# Patient Record
Sex: Male | Born: 1955 | Race: White | Hispanic: No | Marital: Married | Smoking: Never smoker
Health system: Southern US, Community
[De-identification: ages and names within clinical notes are randomized; demographics above are authoritative.]

## PROBLEM LIST (undated history)

## (undated) DIAGNOSIS — E785 Hyperlipidemia, unspecified: Secondary | ICD-10-CM

## (undated) DIAGNOSIS — K219 Gastro-esophageal reflux disease without esophagitis: Secondary | ICD-10-CM

## (undated) HISTORY — DX: Hyperlipidemia, unspecified: E78.5

## (undated) HISTORY — PX: BACK SURGERY: SHX140

---

## 2011-01-21 ENCOUNTER — Ambulatory Visit: Payer: Self-pay | Admitting: Family Medicine

## 2014-07-26 ENCOUNTER — Emergency Department (HOSPITAL_COMMUNITY): Payer: Managed Care, Other (non HMO)

## 2014-07-26 ENCOUNTER — Encounter (HOSPITAL_COMMUNITY): Payer: Self-pay | Admitting: *Deleted

## 2014-07-26 ENCOUNTER — Observation Stay (HOSPITAL_COMMUNITY)
Admission: EM | Admit: 2014-07-26 | Discharge: 2014-07-27 | Disposition: A | Discharge status: Home / Self Care | Payer: Managed Care, Other (non HMO) | Attending: Internal Medicine | Admitting: Internal Medicine

## 2014-07-26 DIAGNOSIS — R079 Chest pain, unspecified: Secondary | ICD-10-CM | POA: Diagnosis not present

## 2014-07-26 DIAGNOSIS — Z79899 Other long term (current) drug therapy: Secondary | ICD-10-CM | POA: Insufficient documentation

## 2014-07-26 DIAGNOSIS — R030 Elevated blood-pressure reading, without diagnosis of hypertension: Secondary | ICD-10-CM

## 2014-07-26 DIAGNOSIS — E66811 Obesity, class 1: Secondary | ICD-10-CM

## 2014-07-26 DIAGNOSIS — Z7982 Long term (current) use of aspirin: Secondary | ICD-10-CM | POA: Diagnosis not present

## 2014-07-26 DIAGNOSIS — E669 Obesity, unspecified: Secondary | ICD-10-CM

## 2014-07-26 DIAGNOSIS — IMO0001 Reserved for inherently not codable concepts without codable children: Secondary | ICD-10-CM

## 2014-07-26 DIAGNOSIS — M5412 Radiculopathy, cervical region: Secondary | ICD-10-CM

## 2014-07-26 DIAGNOSIS — E785 Hyperlipidemia, unspecified: Secondary | ICD-10-CM

## 2014-07-26 HISTORY — DX: Gastro-esophageal reflux disease without esophagitis: K21.9

## 2014-07-26 LAB — URINALYSIS, ROUTINE W REFLEX MICROSCOPIC
Bilirubin Urine: NEGATIVE
Glucose, UA: NEGATIVE mg/dL
Hgb urine dipstick: NEGATIVE
Ketones, ur: NEGATIVE mg/dL
Leukocytes, UA: NEGATIVE
Nitrite: NEGATIVE
Protein, ur: NEGATIVE mg/dL
Specific Gravity, Urine: 1.018 (ref 1.005–1.030)
Urobilinogen, UA: 0.2 mg/dL (ref 0.0–1.0)
pH: 7.5 (ref 5.0–8.0)

## 2014-07-26 LAB — CBC
HCT: 40.6 % (ref 39.0–52.0)
HCT: 39.4 % (ref 39.0–52.0)
HEMOGLOBIN: 13.8 g/dL (ref 13.0–17.0)
Hemoglobin: 14.3 g/dL (ref 13.0–17.0)
MCH: 29.3 pg (ref 26.0–34.0)
MCH: 29.1 pg (ref 26.0–34.0)
MCHC: 35.2 g/dL (ref 30.0–36.0)
MCHC: 35 g/dL (ref 30.0–36.0)
MCV: 83.2 fL (ref 78.0–100.0)
MCV: 82.9 fL (ref 78.0–100.0)
Platelets: 243 10*3/uL (ref 150–400)
Platelets: 225 10*3/uL (ref 150–400)
RBC: 4.88 MIL/uL (ref 4.22–5.81)
RBC: 4.75 MIL/uL (ref 4.22–5.81)
RDW: 13.4 % (ref 11.5–15.5)
RDW: 13.2 % (ref 11.5–15.5)
WBC: 7.2 10*3/uL (ref 4.0–10.5)
WBC: 6.2 10*3/uL (ref 4.0–10.5)

## 2014-07-26 LAB — BASIC METABOLIC PANEL
Anion gap: 8 (ref 5–15)
BUN: 12 mg/dL (ref 6–23)
CALCIUM: 9.4 mg/dL (ref 8.4–10.5)
CO2: 25 mmol/L (ref 19–32)
Chloride: 107 mmol/L (ref 96–112)
Creatinine, Ser: 1 mg/dL (ref 0.50–1.35)
GFR calc Af Amer: >90 mL/min (ref >90)
GFR calc non Af Amer: 81 mL/min — ABNORMAL LOW (ref >90)
Glucose, Bld: 95 mg/dL (ref 70–99)
Potassium: 4.6 mmol/L (ref 3.5–5.1)
Sodium: 140 mmol/L (ref 135–145)

## 2014-07-26 LAB — TROPONIN I
Troponin I: <0.03 ng/mL (ref <0.031)
Troponin I: <0.03 ng/mL (ref <0.031)
Troponin I: <0.03 ng/mL (ref <0.031)

## 2014-07-26 LAB — CREATININE, SERUM
Creatinine, Ser: 1.15 mg/dL (ref 0.50–1.35)
GFR, EST AFRICAN AMERICAN: 79 mL/min — AB (ref >90)
GFR, EST NON AFRICAN AMERICAN: 68 mL/min — AB (ref >90)

## 2014-07-26 LAB — TSH: TSH: 3.069 u[IU]/mL (ref 0.350–4.500)

## 2014-07-26 MED ORDER — GI COCKTAIL ~~LOC~~: 30.0000 mL | Freq: Four times a day (QID) | ORAL | Status: DC | PRN

## 2014-07-26 MED ORDER — ACETAMINOPHEN 325 MG PO TABS
650.0000 mg | ORAL_TABLET | ORAL | Status: DC | PRN
  Administered 2014-07-26 – 2014-07-27 (×2): 650 mg via ORAL
  Filled 2014-07-26 (×2): qty 2

## 2014-07-26 MED ORDER — ASPIRIN EC 325 MG PO TBEC
325.0000 mg | DELAYED_RELEASE_TABLET | Freq: Every day | ORAL | Status: DC
  Administered 2014-07-26 – 2014-07-27 (×2): 325 mg via ORAL
  Filled 2014-07-26 (×2): qty 1

## 2014-07-26 MED ORDER — HEPARIN SODIUM (PORCINE) 5000 UNIT/ML IJ SOLN
5000.0000 [IU] | Freq: Three times a day (TID) | INTRAMUSCULAR | Status: DC
  Administered 2014-07-26 – 2014-07-27 (×3): 5000 [IU] via SUBCUTANEOUS
  Filled 2014-07-26 (×4): qty 1

## 2014-07-26 MED ORDER — ONDANSETRON HCL 4 MG/2ML IJ SOLN: 4.0000 mg | Freq: Four times a day (QID) | INTRAMUSCULAR | Status: DC | PRN

## 2014-07-26 MED ORDER — ZOLPIDEM TARTRATE 5 MG PO TABS
5.0000 mg | ORAL_TABLET | Freq: Every evening | ORAL | Status: DC | PRN
  Administered 2014-07-26: 5 mg via ORAL
  Filled 2014-07-26: qty 1

## 2014-07-26 MED ORDER — NITROGLYCERIN 2 % TD OINT
1.0000 [in_us] | TOPICAL_OINTMENT | Freq: Four times a day (QID) | TRANSDERMAL | Status: DC
  Administered 2014-07-26: 1 [in_us] via TOPICAL
  Filled 2014-07-26: qty 30

## 2014-07-26 NOTE — H&P (Signed)
[facility] History and Physical                                                                                    Anthony [name], is a 59 y.o. male  MRN: [id number]   DOB - 1955/10/27  Admit Date - 07/26/2014  Outpatient Primary MD for the patient is No PCP Per Patient  With History of -  History reviewed. No pertinent past medical history.    History reviewed. No pertinent past surgical history.  in for   Chief Complaint  Patient presents with  . Chest Pain     HPI  Anthony [name]  is a 59 y.o. male with no known pmh, but has not seen a medical provider in several years. Pt presents to ED with 1 week hx of left axillary pain now radiating down left arm. States pain or 'soreness' has been ongoing for one week at first just in axillary region and now spreading down arm. States he has taken motrin for pain with perhaps some minimal relief. Pain is much worse when lying down flat and walking around. He denies any associated sob, diaphoresis, n/v/d. He states that every few months he will have brief episodes of heart palpitations, diaphoresis and sob that will resolve without any intervention. He has attributed these episodes to anxiety. 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 He has no known family hx of coronary disease. Again, he has never seen a primary care provider.  Review of Systems   In addition to the HPI above,  No Fever-chills, No Headache, No changes with Vision or hearing, No problems swallowing food or Liquids, No Abdominal pain, No Nausea or Vomiting, Bowel movements are regular, No Blood in stool or Urine, No dysuria, No new skin rashes or bruises, No new joints pains-aches,  No new weakness, tingling, numbness in any extremity, No recent weight gain or loss, A full 10 point Review of Systems was done, except as stated above, all other Review of Systems were negative.  Social History Married. Works a Naval architecttruck driver. No hx of tobacco or  etoh use  Family History Father deceased age 76:lung ca Mother deceased age 59: surgical complications from gallbladder Sister alive age 59: alive and well No known family hx of coronary disease  Prior to Admission medications   Medication Sig Start Date End Date Taking? Authorizing Provider  aspirin EC 81 MG tablet Take 81 mg by mouth daily as needed for mild pain or moderate pain.   Yes Historical Provider, MD  Aspirin-Salicylamide-Caffeine (BC HEADACHE PO) Take 1 tablet by mouth daily as needed (headache).   Yes Historical Provider, MD  ibuprofen (ADVIL,MOTRIN) 200 MG tablet Take 200 mg by mouth every 6 (six) hours as needed for mild pain or moderate pain.   Yes Historical Provider, MD  Multiple Vitamins-Minerals (ONE DAILY MULTIVITAMIN MEN) TABS Take 1 tablet by mouth daily.   Yes Historical Provider, MD    No Known Allergies  Physical Exam  Vitals  Blood pressure 146/77, pulse 54, temperature 98.2 F (36.8 C), temperature source Oral, resp. rate 16, SpO2 98 %.   General:  Awake, alert sitting upright in bed in NAD  Psych:  Normal affect and insight, Not Suicidal or Homicidal, Awake Alert, Oriented X 3.  Neuro:   No F.N deficits, ALL C.Nerves Intact, Strength 5/5 all 4 extremities, Sensation intact all 4 extremities.  ENT:  Ears and Eyes appear Normal, Conjunctivae clear, PER. Moist oral mucosa without erythema or exudates.  Neck:  Supple, No lymphadenopathy appreciated  Respiratory:  Symmetrical chest wall movement, Good air movement bilaterally, CTAB.  Cardiac:  RRR, No Murmurs, no LE edema noted, no JVD.    Abdomen:  Positive bowel sounds, Soft, Non tender, Non distended,  No masses appreciated  Skin:  No Cyanosis, Normal Skin Turgor, No Skin Rash or Bruise.  Extremities:  Able to move all 4. 5/5 strength in each,  no effusions.  Data Review  CBC  Recent Labs Lab 07/26/14 1045  WBC 7.2  HGB 14.3  HCT 40.6  PLT 243  MCV 83.2  MCH 29.3  MCHC 35.2  RDW  13.4    Chemistries   Recent Labs Lab 07/26/14 1045  NA 140  K 4.6  CL 107  CO2 25  GLUCOSE 95  BUN 12  CREATININE 1.00  CALCIUM 9.4    CrCl cannot be calculated (Unknown ideal weight.).  No results for input(s): TSH, T4TOTAL, T3FREE, THYROIDAB in the last 72 hours.  Invalid input(s): FREET3  Coagulation profile No results for input(s): INR, PROTIME in the last 168 hours.  No results for input(s): DDIMER in the last 72 hours.  Cardiac Enzymes  Recent Labs Lab 07/26/14 1045  TROPONINI <0.03    Invalid input(s): POCBNP  Urinalysis    Component Value Date/Time   COLORURINE YELLOW 07/26/2014 1051   APPEARANCEUR CLEAR 07/26/2014 1051   LABSPEC 1.018 07/26/2014 1051   PHURINE 7.5 07/26/2014 1051   GLUCOSEU NEGATIVE 07/26/2014 1051   HGBUR NEGATIVE 07/26/2014 1051   BILIRUBINUR NEGATIVE 07/26/2014 1051   KETONESUR NEGATIVE 07/26/2014 1051   PROTEINUR NEGATIVE 07/26/2014 1051   UROBILINOGEN 0.2 07/26/2014 1051   NITRITE NEGATIVE 07/26/2014 1051   LEUKOCYTESUR NEGATIVE 07/26/2014 1051    Imaging results:   Dg Chest 2 View  07/26/2014   CLINICAL DATA:  Left-sided chest pain radiating into the left arm for 1 week, initial encounter  EXAM: CHEST  2 VIEW  COMPARISON:  None.  FINDINGS: The heart size and mediastinal contours are within normal limits. Both lungs are clear. The visualized skeletal structures are unremarkable.  IMPRESSION: No active cardiopulmonary disease.   Electronically Signed   By: Alcide Clever M.D.   On: 07/26/2014 11:05    My personal review of EKG: NSR, no old ekgs available for comparison. No acute issues   Assessment & Plan  Active Problems:   Chest pain  1. Chest pain -mostly atypical presentation, but given lack of outpt care and some concerning hx will admit to rule out -discussed pt with Dr. Johney Frame though no formal consult requested. Will order for myoview in am. If can't do tomorrow and pt rules out, consider outpt follow-up for  further testing.  -check FLP, TSH, A1C, 2decho  DVT Prophylaxis: sq heparin  AM Labs Ordered, also please review Full Orders  Family Communication: wife at bedside on exam  Code Status:   full Condition:  stable  Time spent in minutes : 45   Cordelia Pen, NP on 07/26/2014 at 3:18 PM Between 7am to 7pm - Pager - [phone] After 7pm go to [email or web address] - password [email or web address]  And  look for the night coverage person covering me after hours  [facility]

## 2014-07-26 NOTE — ED Notes (Signed)
Pt reports left side chest pains x 1 week, now radiates down left arm. Pain increases with movement and relieved when sitting upright. Has sob with exertion. Denies swelling to extremities.

## 2014-07-26 NOTE — ED Provider Notes (Signed)
CSN: [id number]     Arrival date & time 07/26/14  1015 History   First MD Initiated Contact with Patient 07/26/14 1027     Chief Complaint  Patient presents with  . Chest Pain     (Consider location/radiation/quality/duration/timing/severity/associated sxs/prior Treatment) HPI Patient presents to the emergency department with chest pain that started 1 week ago.  The patient states that also seems to radiate somewhat into his arm.  The patient states that he is not having some shortness of breath with exertion for quite a while.  The patient denies being a smoker.  He states that he does not have any abdominal pain, nausea, vomiting, weakness, dizziness, headache, blurred vision, back pain, cough, fever, rash, lightheadedness, near syncope or syncope.  The patient states that nothing seems make his condition, better other than sitting upright seems to relieve some of the pressure History reviewed. No pertinent past medical history. History reviewed. No pertinent past surgical history. History reviewed. No pertinent family history. History  Substance Use Topics  . Smoking status: Not on file  . Smokeless tobacco: Not on file  . Alcohol Use: No    Review of Systems All other systems negative except as documented in the HPI. All pertinent positives and negatives as reviewed in the HPI.   Allergies  Review of patient's allergies indicates no known allergies.  Home Medications   Prior to Admission medications   Medication Sig Start Date End Date Taking? Authorizing Provider  aspirin EC 81 MG tablet Take 81 mg by mouth daily as needed for mild pain or moderate pain.   Yes Historical Provider, MD  Aspirin-Salicylamide-Caffeine (BC HEADACHE PO) Take 1 tablet by mouth daily as needed (headache).   Yes Historical Provider, MD  ibuprofen (ADVIL,MOTRIN) 200 MG tablet Take 200 mg by mouth every 6 (six) hours as needed for mild pain or moderate pain.   Yes Historical Provider, MD  Multiple  Vitamins-Minerals (ONE DAILY MULTIVITAMIN MEN) TABS Take 1 tablet by mouth daily.   Yes Historical Provider, MD   BP 135/77 mmHg  Pulse 50  Temp(Src) 98.2 F (36.8 C) (Oral)  Resp 15  SpO2 99% Physical Exam  Constitutional: He is oriented to person, place, and time. He appears well-developed and well-nourished. No distress.  HENT:  Head: Normocephalic and atraumatic.  Mouth/Throat: Oropharynx is clear and moist.  Eyes: Pupils are equal, round, and reactive to light.  Neck: Normal range of motion. Neck supple.  Cardiovascular: Normal rate, regular rhythm and normal heart sounds.  Exam reveals no gallop and no friction rub.   No murmur heard. Pulmonary/Chest: Effort normal and breath sounds normal. No respiratory distress.  Abdominal: Soft. Bowel sounds are normal. He exhibits no distension. There is no tenderness.  Musculoskeletal: He exhibits no edema.  Neurological: He is alert and oriented to person, place, and time. He exhibits normal muscle tone. Coordination normal.  Skin: Skin is warm and dry.  Psychiatric: He has a normal mood and affect. His behavior is normal.  Nursing note and vitals reviewed.   ED Course  Procedures (including critical care time) Labs Review Labs Reviewed  BASIC METABOLIC PANEL - Abnormal; Notable for the following:    GFR calc non Af Amer 81 (*)    All other components within normal limits  CBC  URINALYSIS, ROUTINE W REFLEX MICROSCOPIC  TROPONIN I  BRAIN NATRIURETIC PEPTIDE    Imaging Review Dg Chest 2 View  07/26/2014   CLINICAL DATA:  Left-sided chest pain radiating into the  left arm for 1 week, initial encounter  EXAM: CHEST  2 VIEW  COMPARISON:  None.  FINDINGS: The heart size and mediastinal contours are within normal limits. Both lungs are clear. The visualized skeletal structures are unremarkable.  IMPRESSION: No active cardiopulmonary disease.   Electronically Signed   By: Alcide CleverMark  Lukens M.D.   On: 07/26/2014 11:05     EKG  Interpretation   Date/Time:  Saturday July 26 2014 11:08:08 EST Ventricular Rate:  58 PR Interval:  118 QRS Duration: 94 QT Interval:  401 QTC Calculation: 394 R Axis:   58 Text Interpretation:  Sinus rhythm Borderline short PR interval Minimal ST  depression, diffuse leads no significant change since earlier in the day  Confirmed by GOLDSTON  MD, SCOTT ([id number]) on 07/26/2014 11:25:00 AM     Patient be admitted to the hospital for further evaluation and care.  Patient agrees the plan.  All questions were answered.  I spoke with the [facility] and cardiology about the patient MDM   Final diagnoses:  None       Carlyle DollyChristopher W Lorell Thibodaux, PA-C 07/31/14 [phone]  Audree CamelScott T Goldston, MD 07/31/14 [phone]

## 2014-07-27 ENCOUNTER — Observation Stay (HOSPITAL_COMMUNITY): Payer: Managed Care, Other (non HMO)

## 2014-07-27 ENCOUNTER — Encounter (HOSPITAL_COMMUNITY): Payer: Self-pay | Admitting: *Deleted

## 2014-07-27 DIAGNOSIS — R030 Elevated blood-pressure reading, without diagnosis of hypertension: Secondary | ICD-10-CM

## 2014-07-27 DIAGNOSIS — R079 Chest pain, unspecified: Secondary | ICD-10-CM

## 2014-07-27 DIAGNOSIS — E784 Other hyperlipidemia: Secondary | ICD-10-CM

## 2014-07-27 DIAGNOSIS — IMO0001 Reserved for inherently not codable concepts without codable children: Secondary | ICD-10-CM

## 2014-07-27 DIAGNOSIS — E669 Obesity, unspecified: Secondary | ICD-10-CM

## 2014-07-27 DIAGNOSIS — E785 Hyperlipidemia, unspecified: Secondary | ICD-10-CM

## 2014-07-27 DIAGNOSIS — M5412 Radiculopathy, cervical region: Secondary | ICD-10-CM

## 2014-07-27 LAB — LIPID PANEL
Cholesterol: 236 mg/dL — ABNORMAL HIGH (ref 0–200)
HDL: 37 mg/dL — ABNORMAL LOW (ref >39)
LDL Cholesterol: 169 mg/dL — ABNORMAL HIGH (ref 0–99)
TRIGLYCERIDES: 150 mg/dL — AB (ref <150)
Total CHOL/HDL Ratio: 6.4 RATIO
VLDL: 30 mg/dL (ref 0–40)

## 2014-07-27 MED ORDER — TRAMADOL HCL 50 MG PO TABS: 50.0000 mg | ORAL_TABLET | Freq: Four times a day (QID) | ORAL | Status: DC | PRN

## 2014-07-27 MED ORDER — TRAMADOL HCL 50 MG PO TABS
50.0000 mg | ORAL_TABLET | Freq: Once | ORAL | Status: AC
  Administered 2014-07-27: 50 mg via ORAL
  Filled 2014-07-27: qty 1

## 2014-07-27 MED ORDER — TECHNETIUM TC 99M SESTAMIBI GENERIC - CARDIOLITE
10.0000 | Freq: Once | INTRAVENOUS | Status: AC | PRN
  Administered 2014-07-27: 10 via INTRAVENOUS

## 2014-07-27 MED ORDER — ROSUVASTATIN CALCIUM 10 MG PO TABS: 10.0000 mg | ORAL_TABLET | Freq: Every day | ORAL | Status: DC

## 2014-07-27 MED ORDER — REGADENOSON 0.4 MG/5ML IV SOLN
0.4000 mg | Freq: Once | INTRAVENOUS | Status: AC
  Administered 2014-07-27: 0.4 mg via INTRAVENOUS

## 2014-07-27 MED ORDER — OMEPRAZOLE 20 MG PO CPDR: 20.0000 mg | DELAYED_RELEASE_CAPSULE | Freq: Every day | ORAL | Status: DC

## 2014-07-27 MED ORDER — LISINOPRIL 10 MG PO TABS: 10.0000 mg | ORAL_TABLET | Freq: Every day | ORAL | Status: DC

## 2014-07-27 MED ORDER — IBUPROFEN 800 MG PO TABS: 800.0000 mg | ORAL_TABLET | Freq: Three times a day (TID) | ORAL | Status: DC

## 2014-07-27 MED ORDER — TECHNETIUM TC 99M SESTAMIBI GENERIC - CARDIOLITE
30.0000 | Freq: Once | INTRAVENOUS | Status: AC | PRN
  Administered 2014-07-27: 30 via INTRAVENOUS

## 2014-07-27 MED ORDER — REGADENOSON 0.4 MG/5ML IV SOLN
INTRAVENOUS | Status: AC
Start: 2014-07-27 — End: 2014-07-27
  Administered 2014-07-27: 15:00:00
  Filled 2014-07-27: qty 5

## 2014-07-27 NOTE — Progress Notes (Signed)
Utilization Review Completed.   Vernecia Umble, RN, BSN Nurse Case Manager  

## 2014-07-27 NOTE — Progress Notes (Signed)
  Echocardiogram 2D Echocardiogram has been performed.  Anthony [name], Anthony [name] 07/27/2014, 2:19 PM

## 2014-07-27 NOTE — Discharge Summary (Signed)
Physician Discharge Summary  Anthony [name] VQQ:[id number] DOB: 07/13/1955 DOA: 07/26/2014  PCP: No PCP Per Patient  Admit date: 07/26/2014 Discharge date: 07/27/2014  Recommendations for Outpatient Follow-up:  1. Follow-up within 1 week of discharge for BMP, follow-up echo report, routine health maintenance including blood pressure management and cholesterol management  Discharge Diagnoses:  Principal Problem:   Cervical radicular pain Active Problems:   Chest pain   Pain in the chest   Dyslipidemia with elevated low density lipoprotein (LDL) cholesterol and abnormally low high density lipoprotein (HDL) cholesterol   Elevated blood pressure   Obesity (BMI 30.0-34.9)   Discharge Condition: Stable, improved  Diet recommendation: Mediterranean  Wt Readings from Last 3 Encounters:  07/27/14 105.824 kg (233 lb 4.8 oz)    History of present illness:  59 year old male with no known past medical history because he has not seen a doctor in many years who presented to the emergency department with a one-week history of left axillary pain that radiated down his left arm. The pain was worse when lying flat, looking up, and walking around. He denied any associated shortness of breath, diaphoresis, nausea, vomiting, lightheadedness. Separate from his left arm pain, he has been having episodes of palpitations with shortness of breath and diaphoresis that her exertional.  Hospital Course:   Cervical radiculopathy is likely causing his left arm pain -  Recommend ibuprofen  TID x 1 week -  Use omeprazole to prevent heart burn/gastritis  -  F/u with PCP in about 1-2 weeks for reassessment and referral for PT if needed  DOE, may be due to deconditioning, however, also evaluate for heart failure -  Nonsmoker so COPD less likely -  CXR unremarkable -  Encouraged weight loss -  ECHO report pending  Dyslipidemia -  ASCVD risk 14% in 58yo M with LDL < 190 (167) -  Moderate or high-intensity  statin recommended for primary prevention -  Provided Rx for rosuvastatin  daily which may be titrated up as tolerated  Lab Results  Component Value Date   CHOL 236* 07/27/2014   HDL 37* 07/27/2014   LDLCALC 169* 07/27/2014   TRIG 150* 07/27/2014   CHOLHDL 6.4 07/27/2014   Elevated blood pressures, suspect underlying hypertension -  Start lisinopril  daily  Obesity, BMI 32 -  Encouraged weight loss with Mediterranean diet  Procedures:  NM stress  ECHO  CXR  Consultations:  none  Discharge Exam: Filed Vitals:   07/27/14 1230  BP: 114/64  Pulse: 60  Temp: 98 F (36.7 C)  Resp: 18   Filed Vitals:   07/27/14 1043 07/27/14 1045 07/27/14 1046 07/27/14 1230  BP: 143/85 143/77 145/84 114/64  Pulse: 83 75 69 60  Temp:    98 F (36.7 C)  TempSrc:    Oral  Resp:    18  Height:      Weight:      SpO2:    96%    General: obese male, NAD Cardiovascular: RRR, no mrg, 2+ pulses Respiratory: CTAB, no increased WOB ABD:  NABS, soft, NT/ND MSK:  Normal tone and bulk, no LEE.  No pain with palpation of the left pect, biceps, forearm at rest.  Pain not worsened by looking side to side, but was worsened by looking up.  2+ pulse, < 2 sec CR Neuro:  Pain in left forearm when looking up  Discharge Instructions      Discharge Instructions    Call MD for:  difficulty breathing, headache or  visual disturbances    Complete by:  As directed      Call MD for:  extreme fatigue    Complete by:  As directed      Call MD for:  hives    Complete by:  As directed      Call MD for:  persistant dizziness or light-headedness    Complete by:  As directed      Call MD for:  persistant nausea and vomiting    Complete by:  As directed      Call MD for:  severe uncontrolled pain    Complete by:  As directed      Call MD for:  temperature >100.4    Complete by:  As directed      Diet - low sodium heart healthy    Complete by:  As directed   Mediterranean diet     Discharge  instructions    Complete by:  As directed   You were hospitalized with left arm pain which is most likely due to a cervical radiculopathy or pinched nerve in the neck.  Please take ibuprofen 800mg  three times a day for the next 7 days and then as needed for pain.  The omeprazole will help your indigestion while taking the ibuprofen, but you may stop taking it after you stop taking the ibuprofen.  You may take tylenol 1000mg  up to three times a day as needed for breakthrough pain.  I have also included a prescription for ultram to use for breakthrough pain sparingly.  You may not drive or operate heavy machinery while taking ultram.  You have more than a 14% risk of having a heart attack or stroke in the next 10 years based on your current age, cholesterol, blood pressure.  To reduce your risk, please lose weight (goal weight 200-lbs).  Please start taking lisinopril for blood pressure and rosuvastatin (crestor) for cholesterol.  Follow up with your primary care doctor in 1-2 weeks for blood work and to follow up on your diabetes screening test and heart ultrasound results.     Increase activity slowly    Complete by:  As directed             Medication List    STOP taking these medications        aspirin EC 81 MG tablet      TAKE these medications        BC HEADACHE PO  Take 1 tablet by mouth daily as needed (headache).     ibuprofen 800 MG tablet  Commonly known as:  ADVIL,MOTRIN  Take 1 tablet (800 mg total) by mouth 3 (three) times daily.     lisinopril 10 MG tablet  Commonly known as:  PRINIVIL,ZESTRIL  Take 1 tablet (10 mg total) by mouth daily.     omeprazole 20 MG capsule  Commonly known as:  PRILOSEC  Take 1 capsule (20 mg total) by mouth daily.     ONE DAILY MULTIVITAMIN MEN Tabs  Take 1 tablet by mouth daily.     rosuvastatin 10 MG tablet  Commonly known as:  CRESTOR  Take 1 tablet (10 mg total) by mouth daily.     traMADol 50 MG tablet  Commonly known as:  ULTRAM   Take 1 tablet (50 mg total) by mouth every 6 (six) hours as needed for severe pain.          The results of significant diagnostics from this hospitalization (including imaging, microbiology,  ancillary and laboratory) are listed below for reference.    Significant Diagnostic Studies: Dg Chest 2 View  07/26/2014   CLINICAL DATA:  Left-sided chest pain radiating into the left arm for 1 week, initial encounter  EXAM: CHEST  2 VIEW  COMPARISON:  None.  FINDINGS: The heart size and mediastinal contours are within normal limits. Both lungs are clear. The visualized skeletal structures are unremarkable.  IMPRESSION: No active cardiopulmonary disease.   Electronically Signed   By: Alcide Clever M.D.   On: 07/26/2014 11:05   Nm Myocar Multi W/spect W/wall Motion / Ef  07/27/2014   CLINICAL DATA:  59 year old male with chest pain.  EXAM: MYOCARDIAL IMAGING WITH SPECT (REST AND PHARMACOLOGIC-STRESS)  GATED LEFT VENTRICULAR WALL MOTION STUDY  LEFT VENTRICULAR EJECTION FRACTION  TECHNIQUE: Standard myocardial SPECT imaging was performed after resting intravenous injection of 10 mCi Tc-25m sestamibi. Subsequently, intravenous infusion of Lexiscan was performed under the supervision of the Cardiology staff. At peak effect of the drug, 30 mCi Tc-1m sestamibi was injected intravenously and standard myocardial SPECT imaging was performed. Quantitative gated imaging was also performed to evaluate left ventricular wall motion, and estimate left ventricular ejection fraction.  COMPARISON:  Chest radiograph 07/26/2014  FINDINGS: Perfusion: No decreased activity in the left ventricle on stress imaging to suggest reversible ischemia or infarction. Decrease counts within the inferior wall arrest a stress or felt to relate to attenuation artifact.  Wall Motion: Normal left ventricular wall motion. No left ventricular dilation.  Left Ventricular Ejection Fraction: 58 %  End diastolic volume 108 ml  End systolic volume 46 ml   IMPRESSION: 1. No reversible ischemia or infarction.  2. Normal left ventricular wall motion.  3. Left ventricular ejection fraction 58%  4. Low-risk stress test findings*.  *2012 Appropriate Use Criteria for Coronary Revascularization Focused Update: J Am Coll Cardiol. 2012;59(9):857-881. http://content.dementiazones.com.aspx?articleid=1201161   Electronically Signed   By: Genevive Bi M.D.   On: 07/27/2014 13:51    Microbiology: No results found for this or any previous visit (from the past 240 hour(s)).   Labs: Basic Metabolic Panel:  Recent Labs Lab 07/26/14 1045 07/26/14 1716  NA 140  --   K 4.6  --   CL 107  --   CO2 25  --   GLUCOSE 95  --   BUN 12  --   CREATININE 1.00 1.15  CALCIUM 9.4  --    Liver Function Tests: No results for input(s): AST, ALT, ALKPHOS, BILITOT, PROT, ALBUMIN in the last 168 hours. No results for input(s): LIPASE, AMYLASE in the last 168 hours. No results for input(s): AMMONIA in the last 168 hours. CBC:  Recent Labs Lab 07/26/14 1045 07/26/14 1716  WBC 7.2 6.2  HGB 14.3 13.8  HCT 40.6 39.4  MCV 83.2 82.9  PLT 243 225   Cardiac Enzymes:  Recent Labs Lab 07/26/14 1045 07/26/14 1716 07/26/14 1830 07/26/14 2130  TROPONINI <0.03 <0.03 <0.03 <0.03   BNP: BNP (last 3 results) No results for input(s): BNP in the last 8760 hours.  ProBNP (last 3 results) No results for input(s): PROBNP in the last 8760 hours.  CBG: No results for input(s): GLUCAP in the last 168 hours.  Time coordinating discharge: 35 minutes  Signed:  Zadia Uhde  [facility] 07/27/2014, 2:45 PM

## 2014-07-28 LAB — HEMOGLOBIN A1C
Hgb A1c MFr Bld: 5.5 % (ref 4.8–5.6)
Mean Plasma Glucose: 111 mg/dL

## 2016-07-28 ENCOUNTER — Telehealth: Payer: Self-pay | Admitting: *Deleted

## 2016-07-28 ENCOUNTER — Encounter: Payer: Self-pay | Admitting: *Deleted

## 2016-07-28 ENCOUNTER — Ambulatory Visit (INDEPENDENT_AMBULATORY_CARE_PROVIDER_SITE_OTHER): Payer: Managed Care, Other (non HMO) | Admitting: Cardiology

## 2016-07-28 ENCOUNTER — Encounter: Payer: Self-pay | Admitting: Cardiology

## 2016-07-28 VITALS — BP 134/91 | HR 88 | Ht 72.0 in | Wt 245.8 lb

## 2016-07-28 DIAGNOSIS — E782 Mixed hyperlipidemia: Secondary | ICD-10-CM | POA: Diagnosis not present

## 2016-07-28 DIAGNOSIS — I4891 Unspecified atrial fibrillation: Secondary | ICD-10-CM

## 2016-07-28 MED ORDER — ATORVASTATIN CALCIUM 20 MG PO TABS: 20.0000 mg | ORAL_TABLET | Freq: Every day | ORAL | 3 refills | Status: DC

## 2016-07-28 MED ORDER — METOPROLOL TARTRATE 25 MG PO TABS: 25.0000 mg | ORAL_TABLET | Freq: Two times a day (BID) | ORAL | 3 refills | Status: DC

## 2016-07-28 NOTE — Telephone Encounter (Signed)
Pt aware - routed to pcp  

## 2016-07-28 NOTE — Telephone Encounter (Signed)
-----   Message from Antoine PocheJonathan F Branch, MD sent at 07/28/2016  1:15 PM EST ----- [place]. Normal heart function. Will discuss further at our f/u  J BrancH MD

## 2016-07-28 NOTE — Progress Notes (Signed)
Clinical Summary Anthony [name] is a 61 y.o.male seen as new patient, he is referred by PA Lianne MorisErin Carroll.   1. Afib - new diagnosis noted by EKG 07/22/16 - echo 06/2014 LVEF 60-65%,  - fluttering on and off for several years. Has had some SOB, example just walking to the car. No recent palpitations - CHADS2VASC is 0    2. Chest pain - 06/2014 nuclear stress without ischemia - no recent symptoms.     Past Medical History:  Diagnosis Date  . GERD (gastroesophageal reflux disease)      No Known Allergies   Current Outpatient Prescriptions  Medication Sig Dispense Refill  . Aspirin-Salicylamide-Caffeine (BC HEADACHE PO) Take 1 tablet by mouth daily as needed (headache).    Marland Kitchen. ibuprofen (ADVIL,MOTRIN) 800 MG tablet Take 1 tablet (800 mg total) by mouth 3 (three) times daily. 30 tablet 0  . lisinopril (PRINIVIL,ZESTRIL) 10 MG tablet Take 1 tablet (10 mg total) by mouth daily. 30 tablet 0  . Multiple Vitamins-Minerals (ONE DAILY MULTIVITAMIN MEN) TABS Take 1 tablet by mouth daily.    Marland Kitchen. omeprazole (PRILOSEC) 20 MG capsule Take 1 capsule (20 mg total) by mouth daily. 30 capsule 0  . rosuvastatin (CRESTOR) 10 MG tablet Take 1 tablet (10 mg total) by mouth daily. 30 tablet 0  . traMADol (ULTRAM) 50 MG tablet Take 1 tablet (50 mg total) by mouth every 6 (six) hours as needed for severe pain. 10 tablet 0   No current facility-administered medications for this visit.      Past Surgical History:  Procedure Laterality Date  . BACK SURGERY     disc decompression     No Known Allergies    No family history on file.   Social History Anthony [name] has no tobacco history on file. Anthony [name] reports that he does not drink alcohol.   Review of Systems CONSTITUTIONAL: No weight loss, fever, chills, weakness or fatigue.  HEENT: Eyes: No visual loss, blurred vision, double vision or yellow sclerae.No hearing loss, sneezing, congestion, runny nose or sore throat.  SKIN: No rash or itching.   CARDIOVASCULAR: per HPI RESPIRATORY: No shortness of breath, cough or sputum.  GASTROINTESTINAL: No anorexia, nausea, vomiting or diarrhea. No abdominal pain or blood.  GENITOURINARY: No burning on urination, no polyuria NEUROLOGICAL: No headache, dizziness, syncope, paralysis, ataxia, numbness or tingling in the extremities. No change in bowel or bladder control.  MUSCULOSKELETAL: No muscle, back pain, joint pain or stiffness.  LYMPHATICS: No enlarged nodes. No history of splenectomy.  PSYCHIATRIC: No history of depression or anxiety.  ENDOCRINOLOGIC: No reports of sweating, cold or heat intolerance. No polyuria or polydipsia.  Marland Kitchen.   Physical Examination Vitals:   07/28/16 0851  BP: (!) 134/91  Pulse: 88   Vitals:   07/28/16 0851  Weight: 245 lb 12.8 oz (111.5 kg)  Height: 6' (1.829 m)    Gen: resting comfortably, no acute distress HEENT: no scleral icterus, pupils equal round and reactive, no palptable cervical adenopathy,  CV: irreg, no m/r/g, no jvd Resp: Clear to auscultation bilaterally GI: abdomen is soft, non-tender, non-distended, normal bowel sounds, no hepatosplenomegaly MSK: extremities are warm, no edema.  Skin: warm, no rash Neuro:  no focal deficits Psych: appropriate affect   Diagnostic Studies 06/2014 Nuclear stress IMPRESSION: 1. No reversible ischemia or infarction.  2. Normal left ventricular wall motion.  3. Left ventricular ejection fraction 58%  4. Low-risk stress test findings*.   06/2014 echo Study Conclusions  -  Left ventricle: The cavity size was normal. Wall thickness was normal. Systolic function was normal. The estimated ejection fraction was in the range of 60% to 65%. - Pulmonary arteries: PA peak pressure: 33 mm Hg (S).    Assessment and Plan   1. Afib - ekg in clinic shows afib rate 100 - start lopressor 25mg  bid - CHADS2Vasc score in 0  - request recent echo from his pcp  2. Hyperlipidmia - did not tolerate  crestor. Try atorva 20mg  daily.    F/u 1 month   Antoine Poche, M.D.

## 2016-07-28 NOTE — Patient Instructions (Signed)
Your physician recommends that you schedule a follow-up appointment in: 1 MONTH WITH DR. BRANCH   Your physician has recommended you make the following change in your medication:   STOP CRESTOR   START ATORVASTATIN 20 MG DAILY  START LOPRESSOR (METORPOLOL) 25 MG TWICE DAILY  Thank you for choosing [facility]!!

## 2016-08-31 ENCOUNTER — Ambulatory Visit (INDEPENDENT_AMBULATORY_CARE_PROVIDER_SITE_OTHER): Payer: Managed Care, Other (non HMO) | Admitting: Cardiology

## 2016-08-31 ENCOUNTER — Encounter: Payer: Self-pay | Admitting: Cardiology

## 2016-08-31 ENCOUNTER — Telehealth: Payer: Self-pay | Admitting: Cardiology

## 2016-08-31 VITALS — BP 126/89 | HR 86 | Ht 72.0 in | Wt 245.0 lb

## 2016-08-31 DIAGNOSIS — I712 Thoracic aortic aneurysm, without rupture, unspecified: Secondary | ICD-10-CM

## 2016-08-31 DIAGNOSIS — E782 Mixed hyperlipidemia: Secondary | ICD-10-CM

## 2016-08-31 DIAGNOSIS — I4891 Unspecified atrial fibrillation: Secondary | ICD-10-CM

## 2016-08-31 DIAGNOSIS — I351 Nonrheumatic aortic (valve) insufficiency: Secondary | ICD-10-CM | POA: Diagnosis not present

## 2016-08-31 NOTE — Telephone Encounter (Signed)
CTA THORACIC AORTA  September 12, 2016 at [facility]

## 2016-08-31 NOTE — Progress Notes (Signed)
Clinical Summary Anthony [name] is a 61 y.o.male seen today for follow up of the following medical problems.   1. Afib - new diagnosis noted by EKG 07/22/16 - echo 06/2014 LVEF 60-65%,  - fluttering on and off for several years. Has had some SOB, example just walking to the car. No recent palpitations - CHADS2VASC is 0   - no recent palpitations. Breathing is improving.   2. Chest pain - 06/2014 nuclear stress without ischemia - denies any recent chest pain  3. Aortic regurgitation - moderate by echo 06/2016 - no significant symptoms.   4. Dilated aortic root - mildly dilated ascending aorta by recent echo, reported size 4.1 cm based on visualized portion.   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 GERD (gastroesophageal reflux disease)   . Hyperlipidemia      No Known Allergies   Current Outpatient Prescriptions  Medication Sig Dispense Refill  . aspirin EC 81 MG tablet Take 81 mg by mouth daily.    Marland Kitchen atorvastatin (LIPITOR) 20 MG tablet Take 1 tablet (20 mg total) by mouth daily. 30 tablet 3  . metoprolol tartrate (LOPRESSOR) 25 MG tablet Take 1 tablet (25 mg total) by mouth 2 (two) times daily. 60 tablet 3  . Multiple Vitamins-Minerals (ONE DAILY MULTIVITAMIN MEN) TABS Take 1 tablet by mouth daily.     No current facility-administered medications for this visit.      Past Surgical History:  Procedure Laterality Date  . BACK SURGERY     disc decompression     No Known Allergies    Family History  Problem Relation Age of Onset  . Lung cancer Father   . Heart attack Maternal Grandfather      Social History Anthony [name] reports that he has never smoked. He has never used smokeless tobacco. Anthony [name] reports that he does not drink alcohol.   Review of Systems CONSTITUTIONAL: No weight loss, fever, chills, weakness or fatigue.  HEENT: Eyes: No visual loss, blurred vision, double vision or yellow sclerae.No  hearing loss, sneezing, congestion, runny nose or sore throat.  SKIN: No rash or itching.  CARDIOVASCULAR: per hpi RESPIRATORY: No shortness of breath, cough or sputum.  GASTROINTESTINAL: No anorexia, nausea, vomiting or diarrhea. No abdominal pain or blood.  GENITOURINARY: No burning on urination, no polyuria NEUROLOGICAL: No headache, dizziness, syncope, paralysis, ataxia, numbness or tingling in the extremities. No change in bowel or bladder control.  MUSCULOSKELETAL: No muscle, back pain, joint pain or stiffness.  LYMPHATICS: No enlarged nodes. No history of splenectomy.  PSYCHIATRIC: No history of depression or anxiety.  ENDOCRINOLOGIC: No reports of sweating, cold or heat intolerance. No polyuria or polydipsia.  Marland Kitchen   Physical Examination Vitals:   08/31/16 0810  BP: 126/89  Pulse: 86   Filed Weights   08/31/16 0810  Weight: 245 lb (111.1 kg)    Gen: resting comfortably, no acute distress HEENT: no scleral icterus, pupils equal round and reactive, no palptable cervical adenopathy,  CV: RRR, no m/r/g, no jvd Resp: Clear to auscultation bilaterally GI: abdomen is soft, non-tender, non-distended, normal bowel sounds, no hepatosplenomegaly MSK: extremities are warm, no edema.  Skin: warm, no rash Neuro:  no focal deficits Psych: appropriate affect   Diagnostic Studies 06/2014 echo Study Conclusions  - Left ventricle: The cavity size was normal. Wall thickness was normal. Systolic function was normal. The estimated ejection fraction was in the  range of 60% to 65%. - Pulmonary arteries: PA peak pressure: 33 mm Hg (S).   06/2016 echo LVEF 60-65%, grade I diastolic dysfunction, moderate AI     Assessment and Plan   1. Afib - CHADS2Vasc score in 0, he will stay on aspirin - rate controlled, continue current meds - ekg in clinic today shows afib rate 81  2. Hyperlipidmia - did not tolerate crestor, he will stay on atorva  daily.   3. Aortic  regurgitation - modrerate by echo, continue to monitor  4. thoracic aortic aneurysm - will obtain CTA to better define.     Antoine Poche, M.D.

## 2016-08-31 NOTE — Patient Instructions (Signed)
Your physician wants you to follow-up in: 6 MONTHS WITH DR [facility] You will receive a reminder letter in the mail two months in advance. If you don't receive a letter, please call our office to schedule the follow-up appointment.  Your physician recommends that you continue on your current medications as directed. Please refer to the Current Medication list given to you today.  Non-Cardiac CT Angiography (CTA), is a special type of CT scan that uses a computer to produce multi-dimensional views of major blood vessels throughout the body. In CT angiography, a contrast material is injected through an IV to help visualize the blood vessels  Your physician recommends that you return for lab work BEFORE YOUR TEST BMP  Thank you for choosing [facility]!!

## 2016-09-07 ENCOUNTER — Telehealth: Payer: Self-pay | Admitting: *Deleted

## 2016-09-07 NOTE — Telephone Encounter (Signed)
Pt aware - Routed to pcp

## 2016-09-07 NOTE — Telephone Encounter (Signed)
-----   Message from Antoine Poche, MD sent at 09/06/2016 11:21 AM EDT ----- Labs look good  Dominga Ferry MD

## 2016-09-08 NOTE — Telephone Encounter (Signed)
[employer] WUJW#[id number] EXP 11-01-16

## 2016-09-12 ENCOUNTER — Ambulatory Visit (HOSPITAL_COMMUNITY)
Admission: RE | Admit: 2016-09-12 | Discharge: 2016-09-12 | Disposition: A | Discharge status: Home / Self Care | Payer: Managed Care, Other (non HMO) | Source: Ambulatory Visit | Attending: Cardiology | Admitting: Cardiology

## 2016-09-12 DIAGNOSIS — K449 Diaphragmatic hernia without obstruction or gangrene: Secondary | ICD-10-CM | POA: Insufficient documentation

## 2016-09-12 DIAGNOSIS — I712 Thoracic aortic aneurysm, without rupture, unspecified: Secondary | ICD-10-CM

## 2016-09-12 DIAGNOSIS — I700 Atherosclerosis of aorta: Secondary | ICD-10-CM | POA: Insufficient documentation

## 2016-09-12 LAB — POCT I-STAT, CHEM 8
BUN: 12 mg/dL (ref 6–20)
CHLORIDE: 105 mmol/L (ref 101–111)
Calcium, Ion: 1.17 mmol/L (ref 1.15–1.40)
Creatinine, Ser: 1.1 mg/dL (ref 0.61–1.24)
Glucose, Bld: 86 mg/dL (ref 65–99)
HEMATOCRIT: 40 % (ref 39.0–52.0)
Hemoglobin: 13.6 g/dL (ref 13.0–17.0)
POTASSIUM: 4 mmol/L (ref 3.5–5.1)
SODIUM: 141 mmol/L (ref 135–145)
TCO2: 26 mmol/L (ref 0–100)

## 2016-09-12 MED ORDER — IOPAMIDOL (ISOVUE-370) INJECTION 76%
100.0000 mL | Freq: Once | INTRAVENOUS | Status: AC | PRN
  Administered 2016-09-12: 100 mL via INTRAVENOUS

## 2016-09-14 ENCOUNTER — Telehealth: Payer: Self-pay | Admitting: *Deleted

## 2016-09-14 NOTE — Telephone Encounter (Signed)
Pt wife (DPR) made aware - routed to pcp  

## 2016-09-14 NOTE — Telephone Encounter (Signed)
-----   Message from Antoine Poche, MD sent at 09/13/2016 11:46 AM EDT ----- CT shows only a mild aortic aneurysm, not an issue at this time but we will continue to monitor  Dominga Ferry MD

## 2016-11-17 ENCOUNTER — Other Ambulatory Visit: Payer: Self-pay | Admitting: *Deleted

## 2016-11-17 MED ORDER — METOPROLOL TARTRATE 25 MG PO TABS: 25.0000 mg | ORAL_TABLET | Freq: Two times a day (BID) | ORAL | 3 refills | Status: DC

## 2017-03-01 ENCOUNTER — Encounter: Payer: Self-pay | Admitting: Cardiology

## 2017-03-01 ENCOUNTER — Ambulatory Visit (INDEPENDENT_AMBULATORY_CARE_PROVIDER_SITE_OTHER): Payer: Managed Care, Other (non HMO) | Admitting: Cardiology

## 2017-03-01 VITALS — BP 130/84 | HR 66 | Ht 72.0 in | Wt 247.0 lb

## 2017-03-01 DIAGNOSIS — I712 Thoracic aortic aneurysm, without rupture, unspecified: Secondary | ICD-10-CM

## 2017-03-01 DIAGNOSIS — I4891 Unspecified atrial fibrillation: Secondary | ICD-10-CM

## 2017-03-01 DIAGNOSIS — I351 Nonrheumatic aortic (valve) insufficiency: Secondary | ICD-10-CM | POA: Diagnosis not present

## 2017-03-01 DIAGNOSIS — E785 Hyperlipidemia, unspecified: Secondary | ICD-10-CM

## 2017-03-01 NOTE — Patient Instructions (Addendum)
Medication Instructions:  Continue all current medications.  Labwork:  Lipid panel - order given today.   Reminder:  Nothing to eat or drink after 12 midnight prior to labs.  Office will contact with results via phone or letter.    Testing/Procedures: none  Follow-Up: Your physician wants you to follow up in: 6 months.  You will receive a reminder letter in the mail one-two months in advance.  If you don't receive a letter, please call our office to schedule the follow up appointment   Any Other Special Instructions Will Be Listed Below (If Applicable).  If you need a refill on your cardiac medications before your next appointment, please call your pharmacy.

## 2017-03-01 NOTE — Progress Notes (Signed)
Clinical Summary Anthony [name] is a 61 y.o.male seen today for follow up of the following medical problems.   1. Afib - new diagnosis noted by EKG 07/22/16 - echo 06/2014 LVEF 60-65%,  - fluttering on and off for several years. Has had some SOB, example just walking to the car. No recent palpitations - CHADS2VASC is 0    - no recent palpitations. Compliant with meds  2. Chest pain - 06/2014 nuclear stress without ischemia - denies any recent chest pain  - no recent chest pain since last visit  3. Aortic regurgitation - moderate by echo 06/2016  - denies any SOB/DOE/LE edema  4. Dilated aortic root - mildly dilated ascending aorta by recent echo, reported size 4.1 cm based on visualized portion.  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 GERD (gastroesophageal reflux disease)   . Hyperlipidemia      No Known Allergies   Current Outpatient Prescriptions  Medication Sig Dispense Refill  . aspirin EC 81 MG tablet Take 81 mg by mouth daily.    Marland Kitchen atorvastatin (LIPITOR) 20 MG tablet Take 1 tablet (20 mg total) by mouth daily. 30 tablet 3  . metoprolol tartrate (LOPRESSOR) 25 MG tablet Take 1 tablet (25 mg total) by mouth 2 (two) times daily. 180 tablet 3  . Multiple Vitamins-Minerals (ONE DAILY MULTIVITAMIN MEN) TABS Take 1 tablet by mouth daily.     No current facility-administered medications for this visit.      Past Surgical History:  Procedure Laterality Date  . BACK SURGERY     disc decompression     No Known Allergies    Family History  Problem Relation Age of Onset  . Lung cancer Father   . Heart attack Maternal Grandfather      Social History Anthony [name] reports that he has never smoked. He has never used smokeless tobacco. Anthony [name] reports that he does not drink alcohol.   Review of Systems CONSTITUTIONAL: No weight loss, fever,  chills, weakness or fatigue.  HEENT: Eyes: No visual loss, blurred vision, double vision or yellow sclerae.No hearing loss, sneezing, congestion, runny nose or sore throat.  SKIN: No rash or itching.  CARDIOVASCULAR: per hpi RESPIRATORY: No shortness of breath, cough or sputum.  GASTROINTESTINAL: No anorexia, nausea, vomiting or diarrhea. No abdominal pain or blood.  GENITOURINARY: No burning on urination, no polyuria NEUROLOGICAL: No headache, dizziness, syncope, paralysis, ataxia, numbness or tingling in the extremities. No change in bowel or bladder control.  MUSCULOSKELETAL: No muscle, back pain, joint pain or stiffness.  LYMPHATICS: No enlarged nodes. No history of splenectomy.  PSYCHIATRIC: No history of depression or anxiety.  ENDOCRINOLOGIC: No reports of sweating, cold or heat intolerance. No polyuria or polydipsia.  Marland Kitchen   Physical Examination Vitals:   03/01/17 0831  BP: 130/84  Pulse: 66  SpO2: 96%   Vitals:   03/01/17 0831  Weight: 247 lb (112 kg)  Height: 6' (1.829 m)    Gen: resting comfortably, no acute distress HEENT: no scleral icterus, pupils equal round and reactive, no palptable cervical adenopathy,  CV: RRR, no m/r/g, no jvd Resp: Clear to auscultation bilaterally GI: abdomen is soft, non-tender, non-distended, normal bowel sounds, no hepatosplenomegaly MSK: extremities are warm, no edema.  Skin: warm, no rash Neuro:  no focal deficits Psych: appropriate affect   Diagnostic Studies 06/2014 echo  Study Conclusions  - Left ventricle: The cavity size was normal. Wall thickness was normal. Systolic function was normal. The estimated ejection fraction was in the range of 60% to 65%. - Pulmonary arteries: PA peak pressure: 33 mm Hg (S).   06/2016 echo LVEF 60-65%, grade I diastolic dysfunction, moderate AI  08/2016 CTA chest 1. Ascending thoracic aortic aneurysm measuring up to 42 mm. Recommend annual imaging followup by CTA or MRA. This  recommendation follows 2010 ACCF/AHA/AATS/ACR/ASA/SCA/SCAI/SIR/STS/SVM Guidelines for the Diagnosis and Management of Patients with Thoracic Aortic Disease. Circulation. 2010; 121: Z610-R604. 2. Incidental aberrant right subclavian artery. 3. Small hiatal hernia. 4. Mild calcific atherosclerosis of the aorta.  Assessment and Plan  1. Afib - CHADS2Vasc score in 0, he will stay on aspirin - no recent symptoms, continue lopressor  2. Hyperlipidmia - did not tolerate crestor,changed to atorva  - repeat lipid panel  3. Aortic regurgitation - modrerate by echo, asymptomatic - we will repeat echo next year  4. Thoracic aortic aneurysm - mild by recent CTA, will need repeat CTA next year   F/u 6 months. At that time will need repeat echo and CTA chest.       Antoine Poche, M.D.

## 2017-09-01 ENCOUNTER — Other Ambulatory Visit: Payer: Self-pay | Admitting: *Deleted

## 2017-09-01 DIAGNOSIS — E785 Hyperlipidemia, unspecified: Secondary | ICD-10-CM

## 2017-09-06 ENCOUNTER — Telehealth: Payer: Self-pay | Admitting: *Deleted

## 2017-09-06 ENCOUNTER — Encounter: Payer: Self-pay | Admitting: Cardiology

## 2017-09-06 ENCOUNTER — Encounter: Payer: Self-pay | Admitting: *Deleted

## 2017-09-06 ENCOUNTER — Ambulatory Visit: Payer: Managed Care, Other (non HMO) | Admitting: Cardiology

## 2017-09-06 ENCOUNTER — Telehealth: Payer: Self-pay | Admitting: Cardiology

## 2017-09-06 VITALS — BP 113/74 | HR 80 | Ht 72.0 in | Wt 242.6 lb

## 2017-09-06 DIAGNOSIS — I351 Nonrheumatic aortic (valve) insufficiency: Secondary | ICD-10-CM

## 2017-09-06 DIAGNOSIS — I4891 Unspecified atrial fibrillation: Secondary | ICD-10-CM | POA: Diagnosis not present

## 2017-09-06 DIAGNOSIS — I712 Thoracic aortic aneurysm, without rupture, unspecified: Secondary | ICD-10-CM

## 2017-09-06 DIAGNOSIS — E782 Mixed hyperlipidemia: Secondary | ICD-10-CM

## 2017-09-06 MED ORDER — METOPROLOL TARTRATE 25 MG PO TABS: 37.5000 mg | ORAL_TABLET | Freq: Two times a day (BID) | ORAL | 1 refills | Status: DC

## 2017-09-06 MED ORDER — ATORVASTATIN CALCIUM 40 MG PO TABS: 40.0000 mg | ORAL_TABLET | Freq: Every day | ORAL | 1 refills | Status: DC

## 2017-09-06 NOTE — Telephone Encounter (Signed)
-----   Message from Antoine PocheJonathan F Branch, MD sent at 09/05/2017  1:17 PM EDT ----- Cholesterol too high, please increase atorva to 40mg  daily.

## 2017-09-06 NOTE — Telephone Encounter (Signed)
Pre-cert Verification for the following procedure    CT Angio [facility]

## 2017-09-06 NOTE — Progress Notes (Signed)
Clinical Summary Mr. Katrinka BlazingSmith is a 62 y.o.male seen today for follow up of the following medical problems.   1. Afib - new diagnosis noted by EKG 07/22/16 - echo 06/2014 LVEF 60-65%,  - CHADS2VASC is 0    - no recent palpitations. Can have some spells where he feels SOB, feels weak with activity.  - compliant with meds    2. Chest pain - 06/2014 nuclear stress without ischemia - denies any recent chest pain  - no recent chest pain since last visit  3. Aortic regurgitation - moderate by echo 06/2016 - no recent symptoms.   4. Dilated aortic root - mildly dilated ascending aorta by recent echo, reported size 4.1 cm based on visualized portion.  - 08/2016 CTA with 4.2 cm aneurysm of ascending thoracic aorta  - no recent chest pain   5. 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 GERD (gastroesophageal reflux disease)   . Hyperlipidemia      No Known Allergies   Current Outpatient Medications  Medication Sig Dispense Refill  . aspirin EC 81 MG tablet Take 81 mg by mouth daily.    Marland Kitchen. atorvastatin (LIPITOR) 20 MG tablet     . metoprolol tartrate (LOPRESSOR) 25 MG tablet Take 25 mg by mouth 2 (two) times daily.    . Multiple Vitamins-Minerals (ONE DAILY MULTIVITAMIN MEN) TABS Take 1 tablet by mouth daily.     No current facility-administered medications for this visit.      Past Surgical History:  Procedure Laterality Date  . BACK SURGERY     disc decompression     No Known Allergies    Family History  Problem Relation Age of Onset  . Lung cancer Father   . Heart attack Maternal Grandfather      Social History Mr. Katrinka BlazingSmith reports that he has never smoked. He has never used smokeless tobacco. Mr. Katrinka BlazingSmith reports that he does not drink alcohol.   Review of Systems CONSTITUTIONAL: No weight loss, fever, chills, weakness or  fatigue.  HEENT: Eyes: No visual loss, blurred vision, double vision or yellow sclerae.No hearing loss, sneezing, congestion, runny nose or sore throat.  SKIN: No rash or itching.  CARDIOVASCULAR: per hpi RESPIRATORY: per hpi GASTROINTESTINAL: No anorexia, nausea, vomiting or diarrhea. No abdominal pain or blood.  GENITOURINARY: No burning on urination, no polyuria NEUROLOGICAL: No headache, dizziness, syncope, paralysis, ataxia, numbness or tingling in the extremities. No change in bowel or bladder control.  MUSCULOSKELETAL: No muscle, back pain, joint pain or stiffness.  LYMPHATICS: No enlarged nodes. No history of splenectomy.  PSYCHIATRIC: No history of depression or anxiety.  ENDOCRINOLOGIC: No reports of sweating, cold or heat intolerance. No polyuria or polydipsia.  Marland Kitchen.   Physical Examination Vitals:   09/06/17 0829  BP: 113/74  Pulse: 80  SpO2: 97%   Vitals:   09/06/17 0829  Weight: 242 lb 9.6 oz (110 kg)  Height: 6' (1.829 m)    Gen: resting comfortably, no acute distress HEENT: no scleral icterus, pupils equal round and reactive, no palptable cervical adenopathy,  CV: RRR, no m/r/g no jvd Resp: Clear to auscultation bilaterally GI: abdomen is soft, non-tender, non-distended, normal bowel sounds, no hepatosplenomegaly MSK: extremities are warm, no edema.  Skin: warm, no rash Neuro:  no focal deficits Psych: appropriate affect   Diagnostic Studies  06/2014 echo Study Conclusions  - Left ventricle: The cavity size was normal. Wall thickness was normal. Systolic function was normal. The estimated ejection fraction was in the range of 60% to 65%. - Pulmonary arteries: PA peak pressure: 33 mm Hg (S).   06/2016 echo LVEF 60-65%, grade I diastolic dysfunction, moderate AI  08/2016 CTA chest 1. Ascending thoracic aortic aneurysm measuring up to 42 mm. Recommend annual imaging followup by CTA or MRA. This recommendation follows 2010  ACCF/AHA/AATS/ACR/ASA/SCA/SCAI/SIR/STS/SVM Guidelines for the Diagnosis and Management of Patients with Thoracic Aortic Disease. Circulation. 2010; 121: O962-X528. 2. Incidental aberrant right subclavian artery. 3. Small hiatal hernia. 4. Mild calcific atherosclerosis of the aorta.    Assessment and Plan  1. Afib - CHADS2Vasc score in 0, not on anticoag Reports some symptoms at times, increase lopressor to 37.5mg  bid - ekg in clinic today shows afib rate 83  2. Hyperlipidmia - did not tolerate crestor,changed to atorva 20mg  - not at goal, try increase atorva to 40mg  daily.   3. Aortic regurgitation - modrerate by echo, asymptomatic - repeat echo  4. Thoracic aortic aneurysm - repeat CTA   F/u 6 months.        Antoine Poche, M.D.,

## 2017-09-06 NOTE — Telephone Encounter (Signed)
Pt made aware at today's office visit - routed to pcp  

## 2017-09-06 NOTE — Patient Instructions (Signed)
Your physician wants you to follow-up in: 6 MON[facility]S [facility] You will receive a reminder letter in the mail two months in advance. If you don't receive a letter, please call our office to schedule the follow-up appointment.  Your physician has recommended you make the following change in your medication:   INCREASE ATORVASTATIN 40 MG DAILY  INCREASE LOPRESSOR 37.5 MG (1.5 TABLETS) TWICE DAILY  Your physician has requested that you have an echocardiogram. Echocardiography is a painless test that uses sound waves to create images of your heart. It provides your doctor with information about the size and shape of your heart and how well your heart's chambers and valves are working. This procedure takes approximately one hour. There are no restrictions for this procedure.  Non-Cardiac CT Angiography (CTA), is a special type of CT scan that uses a computer to produce multi-dimensional views of major blood vessels throughout the body. In CT angiography, a contrast material is injected through an IV to help visualize the blood vessels

## 2017-09-10 ENCOUNTER — Encounter: Payer: Self-pay | Admitting: Cardiology

## 2017-09-12 ENCOUNTER — Other Ambulatory Visit: Payer: Self-pay | Admitting: *Deleted

## 2017-09-12 DIAGNOSIS — I712 Thoracic aortic aneurysm, without rupture, unspecified: Secondary | ICD-10-CM

## 2017-09-19 ENCOUNTER — Telehealth: Payer: Self-pay | Admitting: *Deleted

## 2017-09-19 NOTE — Telephone Encounter (Signed)
Pt aware - routed to pcp  

## 2017-09-19 NOTE — Telephone Encounter (Signed)
-----   Message from Antoine PocheJonathan F Branch, MD sent at 09/15/2017 10:50 AM EDT ----- Labs look good   J BrancH MD

## 2017-09-21 ENCOUNTER — Ambulatory Visit (HOSPITAL_COMMUNITY): Payer: Managed Care, Other (non HMO)

## 2017-10-05 ENCOUNTER — Other Ambulatory Visit: Payer: Managed Care, Other (non HMO)

## 2017-10-20 ENCOUNTER — Other Ambulatory Visit: Payer: Self-pay | Admitting: Cardiology

## 2017-10-31 ENCOUNTER — Other Ambulatory Visit: Payer: Self-pay | Admitting: Cardiology

## 2017-11-08 ENCOUNTER — Ambulatory Visit (INDEPENDENT_AMBULATORY_CARE_PROVIDER_SITE_OTHER): Payer: Managed Care, Other (non HMO)

## 2017-11-08 ENCOUNTER — Other Ambulatory Visit: Payer: Self-pay

## 2017-11-08 DIAGNOSIS — I351 Nonrheumatic aortic (valve) insufficiency: Secondary | ICD-10-CM

## 2017-11-09 ENCOUNTER — Telehealth: Payer: Self-pay | Admitting: *Deleted

## 2017-11-09 NOTE — Telephone Encounter (Signed)
-----   Message from Antoine PocheJonathan F Branch, MD sent at 11/09/2017  1:51 PM EDT ----- Echo overall looks good. 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

## 2017-11-09 NOTE — Telephone Encounter (Signed)
Pt aware - routed to pcp  

## 2018-03-21 ENCOUNTER — Ambulatory Visit: Payer: Managed Care, Other (non HMO) | Admitting: Cardiology

## 2018-04-06 ENCOUNTER — Encounter: Payer: Self-pay | Admitting: *Deleted

## 2018-04-06 ENCOUNTER — Encounter: Payer: Self-pay | Admitting: Cardiology

## 2018-04-06 ENCOUNTER — Ambulatory Visit (INDEPENDENT_AMBULATORY_CARE_PROVIDER_SITE_OTHER): Payer: Managed Care, Other (non HMO) | Admitting: Cardiology

## 2018-04-06 VITALS — BP 128/81 | HR 93 | Ht 72.0 in | Wt 247.2 lb

## 2018-04-06 DIAGNOSIS — I4891 Unspecified atrial fibrillation: Secondary | ICD-10-CM

## 2018-04-06 DIAGNOSIS — E782 Mixed hyperlipidemia: Secondary | ICD-10-CM

## 2018-04-06 DIAGNOSIS — I712 Thoracic aortic aneurysm, without rupture, unspecified: Secondary | ICD-10-CM

## 2018-04-06 DIAGNOSIS — I351 Nonrheumatic aortic (valve) insufficiency: Secondary | ICD-10-CM | POA: Diagnosis not present

## 2018-04-06 NOTE — Patient Instructions (Addendum)
Your physician wants you to follow-up in: 1 YEAR WITH DR. BRANCH You will receive a reminder letter in the mail two months in advance. If you don't receive a letter, please call our office to schedule the follow-up appointment.  Your physician recommends that you continue on your current medications as directed. Please refer to the Current Medication list given to you today.  Non-Cardiac CT Angiography (CTA), is a special type of CT scan that uses a computer to produce multi-dimensional views of major blood vessels throughout the body. In CT angiography, a contrast material is injected through an IV to help visualize the blood vessels  [facility] you for choosing  [facility]!!    

## 2018-04-06 NOTE — Progress Notes (Signed)
Clinical Summary Anthony [name] is a 62 y.o.male seen today for follow up of the following medical problems.   1. Afib - new diagnosis noted by EKG 07/22/16 - echo 06/2014 LVEF 60-65%,  - CHADS2VASC is 0    - no recent palpitations. Some fatigue on metoprolol, taking lopressor 25mg  in PM on his own   2. Aortic regurgitation - moderate by echo 06/2016 - no recent symptoms.   10/2017 echo LVEF 55-60%, no WMAs, mild AI, mild MR, mild to mod TR - no recent symptoms.     3. Dilated aortic root - mildly dilated ascending aorta by recent echo, reported size 4.1 cm based on visualized portion. - 08/2016 CTA with 4.2 cm aneurysm of ascending thoracic aorta    4. Hyperlipidemia - 08/2017 TC 236 TG 160 HDL 37 LDL 167 - based on 0/4540 we increased atorva to 40mg  daily.  - upcoming labs with pcp  SH: drives truck for [employer]       Past Medical History:  Diagnosis Date  . GERD (gastroesophageal reflux disease)   . Hyperlipidemia      No Known Allergies   Current Outpatient Medications  Medication Sig Dispense Refill  . aspirin EC 81 MG tablet Take 81 mg by mouth daily.    Marland Kitchen atorvastatin (LIPITOR) 40 MG tablet TAKE 1 TABLET DAILY. 30 tablet 6  . metoprolol tartrate (LOPRESSOR) 25 MG tablet Take 1.5 tablets (37.5 mg total) by mouth 2 (two) times daily. 270 tablet 1  . Multiple Vitamins-Minerals (ONE DAILY MULTIVITAMIN MEN) TABS Take 1 tablet by mouth. Occasionally     No current facility-administered medications for this visit.      Past Surgical History:  Procedure Laterality Date  . BACK SURGERY     disc decompression     No Known Allergies    Family History  Problem Relation Age of Onset  . Lung cancer Father   . Heart attack Maternal Grandfather      Social History Anthony [name] reports that he has never smoked. He has never used smokeless tobacco. Anthony [name] reports that he does not drink alcohol.   Review of  Systems CONSTITUTIONAL: No weight loss, fever, chills, weakness or fatigue.  HEENT: Eyes: No visual loss, blurred vision, double vision or yellow sclerae.No hearing loss, sneezing, congestion, runny nose or sore throat.  SKIN: No rash or itching.  CARDIOVASCULAR: per hpi RESPIRATORY: No shortness of breath, cough or sputum.  GASTROINTESTINAL: No anorexia, nausea, vomiting or diarrhea. No abdominal pain or blood.  GENITOURINARY: No burning on urination, no polyuria NEUROLOGICAL: No headache, dizziness, syncope, paralysis, ataxia, numbness or tingling in the extremities. No change in bowel or bladder control.  MUSCULOSKELETAL: No muscle, back pain, joint pain or stiffness.  LYMPHATICS: No enlarged nodes. No history of splenectomy.  PSYCHIATRIC: No history of depression or anxiety.  ENDOCRINOLOGIC: No reports of sweating, cold or heat intolerance. No polyuria or polydipsia.  Marland Kitchen   Physical Examination Vitals:   04/06/18 0948  BP: 128/81  Pulse: 93  SpO2: 98%   Vitals:   04/06/18 0948  Weight: 247 lb 3.2 oz (112.1 kg)  Height: 6' (1.829 m)    Gen: resting comfortably, no acute distress HEENT: no scleral icterus, pupils equal round and reactive, no palptable cervical adenopathy,  CV: RRR, no m/r/g, no jvd Resp: Clear to auscultation bilaterally GI: abdomen is soft, non-tender, non-distended, normal bowel sounds, no hepatosplenomegaly MSK: extremities are warm, no edema.  Skin: warm, no  rash Neuro:  no focal deficits Psych: appropriate affect   Diagnostic Studies 06/2014 echo Study Conclusions  - Left ventricle: The cavity size was normal. Wall thickness was normal. Systolic function was normal. The estimated ejection fraction was in the range of 60% to 65%. - Pulmonary arteries: PA peak pressure: 33 mm Hg (S).   06/2016 echo LVEF 60-65%, grade I diastolic dysfunction, moderate AI  08/2016 CTA chest 1. Ascending thoracic aortic aneurysm measuring up to 42  mm. Recommend annual imaging followup by CTA or MRA. This recommendation follows 2010 ACCF/AHA/AATS/ACR/ASA/SCA/SCAI/SIR/STS/SVM Guidelines for the Diagnosis and Management of Patients with Thoracic Aortic Disease. Circulation. 2010; 121: Z610-R604. 2. Incidental aberrant right subclavian artery. 3. Small hiatal hernia. 4. Mild calcific atherosclerosis of the aorta.   10/2017 echo Study Conclusions  - Left ventricle: The cavity size was normal. Wall thickness was   normal. Systolic function was normal. The estimated ejection   fraction was in the range of 55% to 60%. Wall motion was normal;   there were no regional wall motion abnormalities. - Aortic valve: There was mild regurgitation. Valve area (VTI):   2.76 cm^2. Valve area (Vmax): 2.64 cm^2. Valve area (Vmean): 2.5   cm^2. - Mitral valve: There was mild regurgitation. - Atrial septum: No defect or patent foramen ovale was identified. - Tricuspid valve: There was mild-moderate regurgitation. - Technically adequate study.  Assessment and Plan  1. Afib - CHADS2Vasc score in 0, not on anticoag - no recent symptoms. He is only taking lopressor 25mg  at night. Continue at this time since no symptoms. If recurrent palpitations would change to dilt since lopressor in day seems to cause fatigue.   2. Hyperlipidmia - f/u pcp labs, recently atorva increased to 40mg  daily  3. Aortic regurgitation - mild by most recent echo, continue to monitor.   4.Thoracic aortic aneurysm -we will repeat his CTA, it has been 1 year    Antoine Poche, M.D.

## 2018-04-10 ENCOUNTER — Telehealth: Payer: Self-pay | Admitting: Cardiology

## 2018-04-10 NOTE — Telephone Encounter (Signed)
Patient called today wanting to know if the following CT Angio Chest can wait until Jan. 1st of 2020. States that they have not meet deductible this year and if they wait until Jan of 2020 this charge will go towards the new deductible.

## 2018-04-11 ENCOUNTER — Encounter: Payer: Self-pay | Admitting: Cardiology

## 2018-04-11 NOTE — Telephone Encounter (Signed)
Yes that is fine, whatever timing is best for him   Dominga FerryJ Remiel Corti MD

## 2018-04-17 ENCOUNTER — Telehealth: Payer: Self-pay | Admitting: Cardiology

## 2018-04-17 NOTE — Telephone Encounter (Signed)
Pre-cert Verification for the following procedure   CT ANGIO CHEST AORTA  [facility]

## 2018-04-17 NOTE — Telephone Encounter (Signed)
Unable to reach patient by telephone.  He wanted to re-schedule his procedure. Letter was mailed to patient asking that [facility] .

## 2018-04-25 ENCOUNTER — Ambulatory Visit (HOSPITAL_COMMUNITY): Admission: RE | Admit: 2018-04-25 | Payer: Managed Care, Other (non HMO) | Source: Ambulatory Visit

## 2018-08-18 IMAGING — CT CT ANGIO CHEST
2 of 6 series · 19 of 46 positions shown · IV contrast (Isovue)
Comparison: None.

CLINICAL DATA: 60 y/o M; evaluate thoracic aortic aneurysm on
recent abnormal echocardiography.

EXAM:
CT ANGIOGRAPHY CHEST WITH CONTRAST
TECHNIQUE: Multidetector CT imaging of the chest was performed using the
standard protocol during bolus administration of intravenous
contrast. Multiplanar CT image reconstructions and MIPs were
obtained to evaluate the vascular anatomy.
CONTRAST:  100 cc Isovue 370

[Series 6: axial arterial · axial · arterial · 0.76mm/px · z∈[+1145,+1451]mm · 16 of 114 slices shown]
[im 6/114  lung]
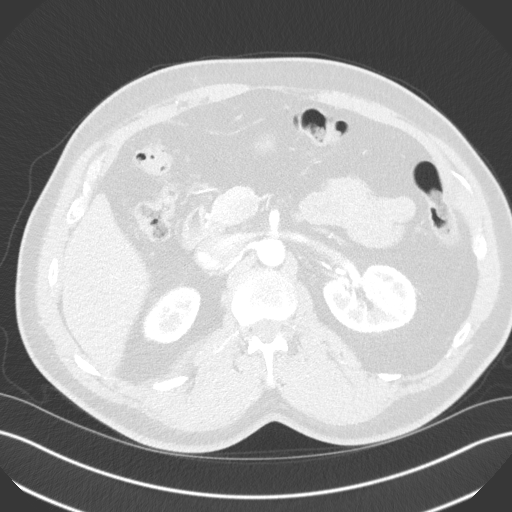
[im 12/114  soft-tissue]
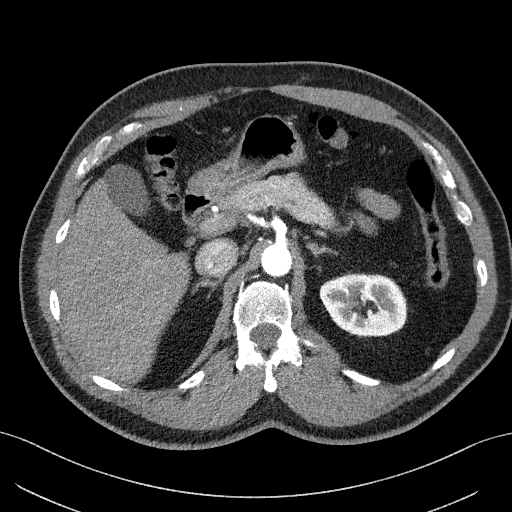
[im 18/114  lung]
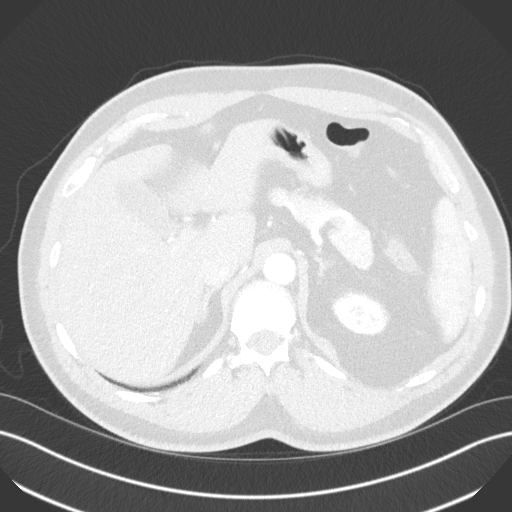
[im 24/114  soft-tissue]
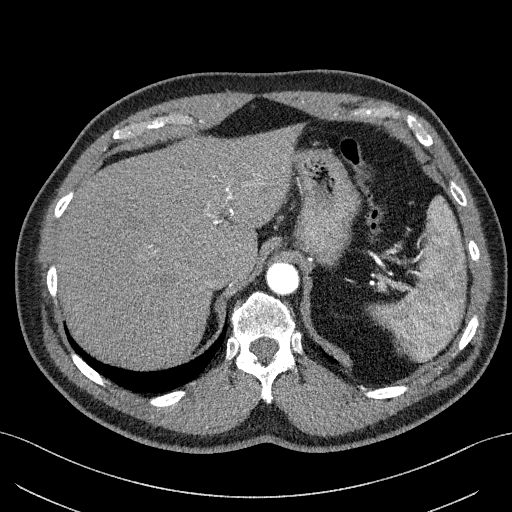
[im 36/114  lung]
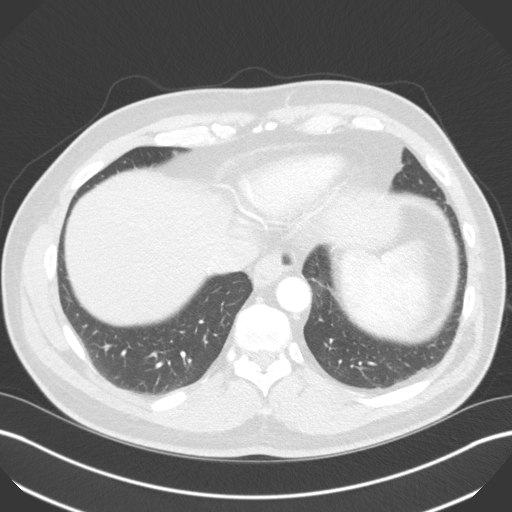
[im 42/114  soft-tissue]
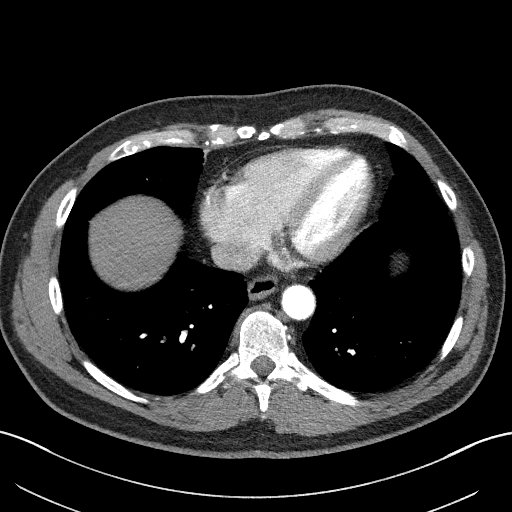
[im 48/114  lung]
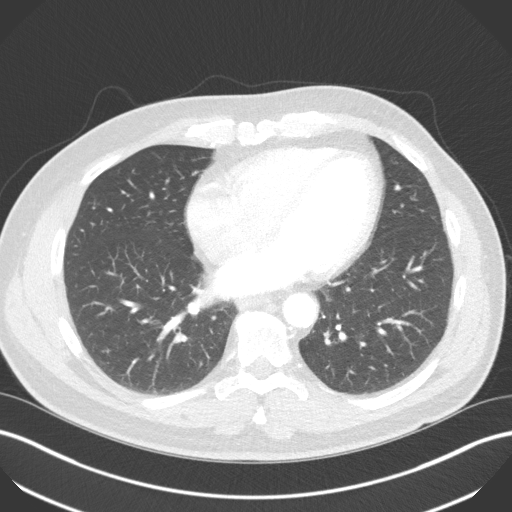
[im 54/114  soft-tissue]
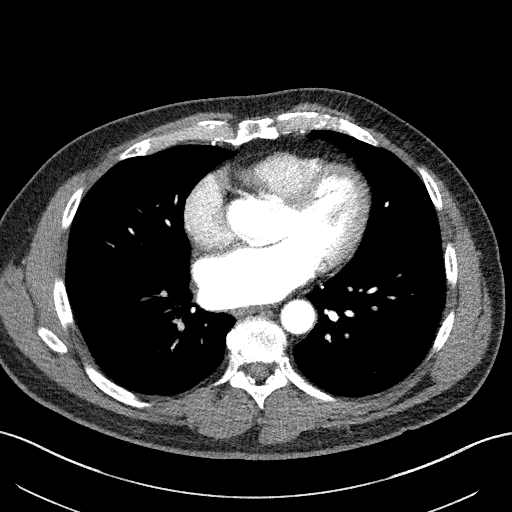
[im 60/114  lung]
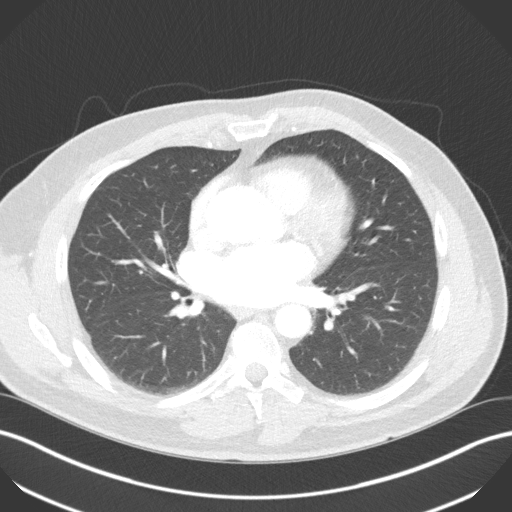
[im 66/114  soft-tissue]
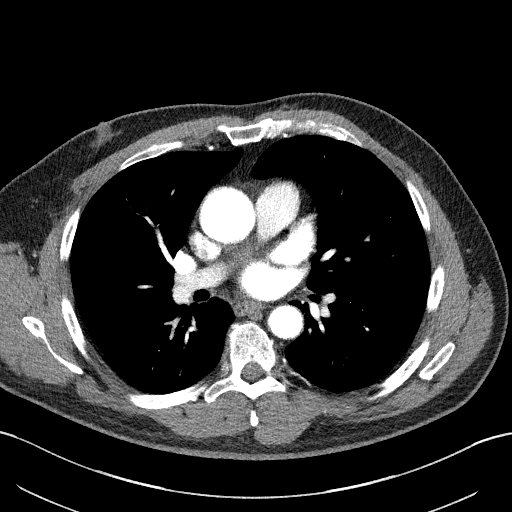
[im 72/114  lung]
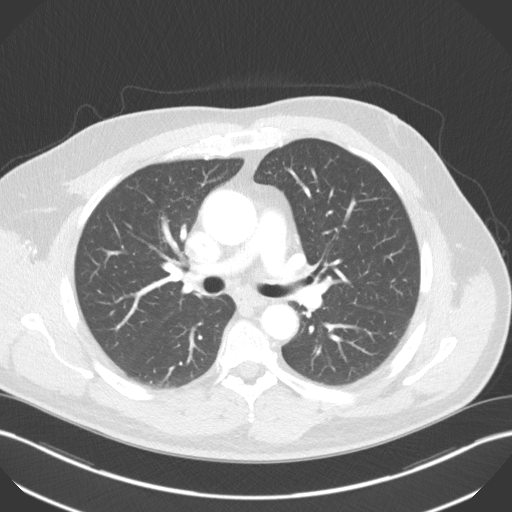
[im 78/114  soft-tissue]
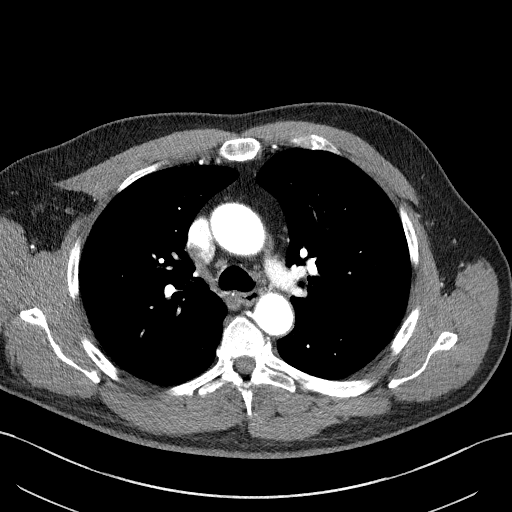
[im 90/114  lung]
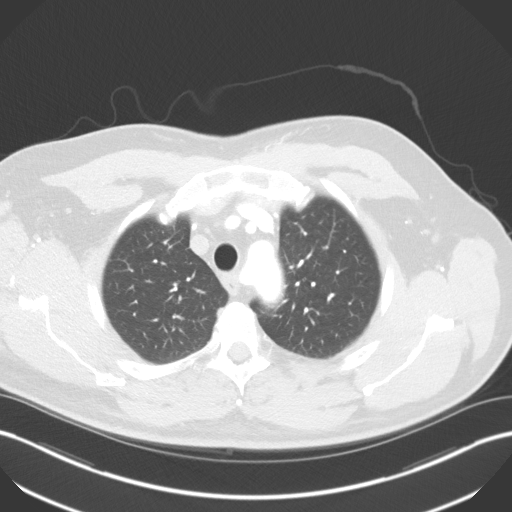
[im 96/114  soft-tissue]
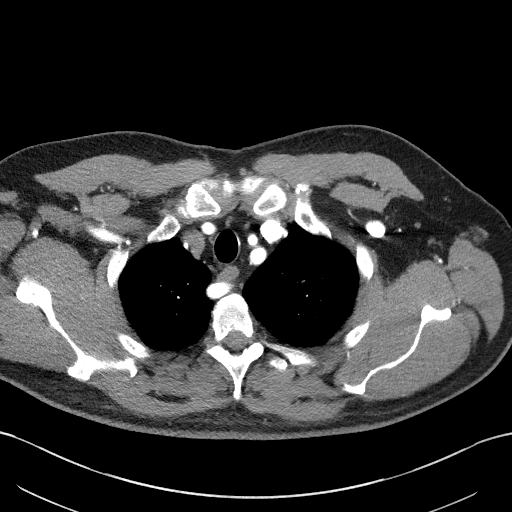
[im 102/114  lung]
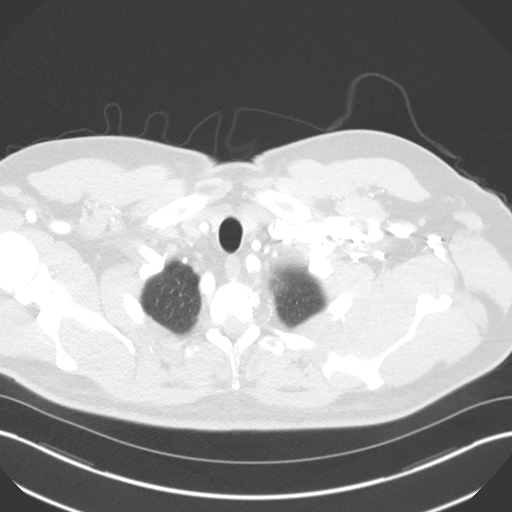
[im 108/114  soft-tissue]
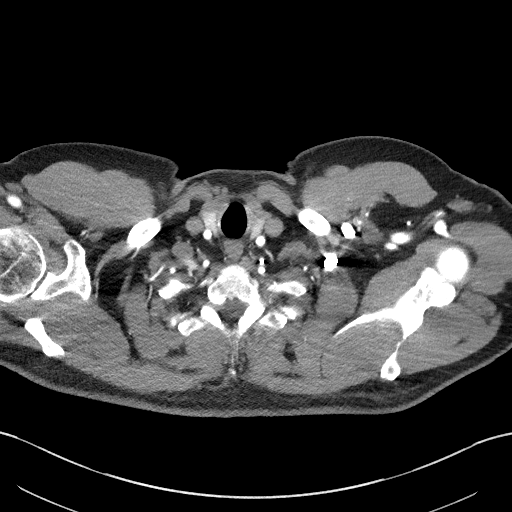

[Series 10: coronals · coronal · 0.73mm/px · 3 of 142 slices shown]
[im 36/142  soft-tissue]
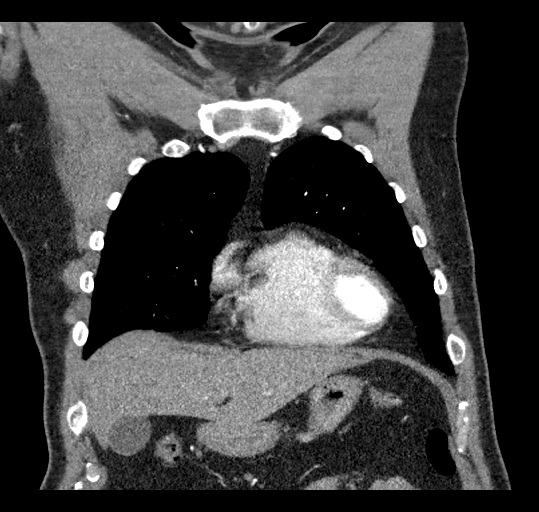
[im 71/142  soft-tissue]
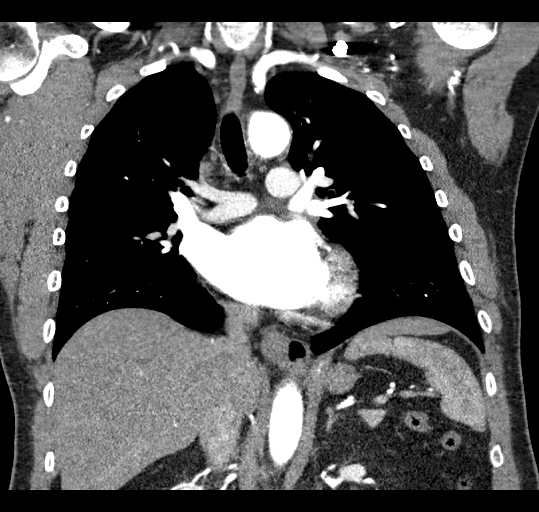
[im 106/142  soft-tissue]
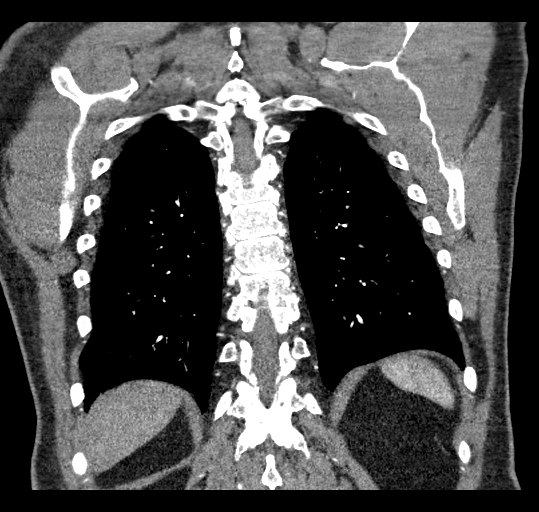

[19 of 46 positions shown; findings below may reference images not displayed]

FINDINGS: Cardiovascular: Preferential opacification of the thoracic aorta.
Ascending aortic aneurysm measuring up to 42 mm. Aberrant right
subclavian artery. Mild calcific atherosclerosis of the thoracic
aorta. Normal heart size. No pericardial effusion.

Mediastinum/Nodes: No enlarged mediastinal, hilar, or axillary lymph
nodes. Thyroid gland, trachea, and esophagus demonstrate no
significant findings.

Lungs/Pleura: Lungs are clear. No pleural effusion or pneumothorax.

Upper Abdomen: Small hiatal hernia.

Musculoskeletal: No chest wall abnormality. No acute or significant
osseous findings.

Review of the MIP images confirms the above findings.
IMPRESSION: 1. Ascending thoracic aortic aneurysm measuring up to 42 mm.
Recommend annual imaging followup by CTA or MRA. This recommendation
follows 8000 ACCF/AHA/AATS/ACR/ASA/SCA/CORBIN/MADE/DAVID J/BD Guidelines
for the Diagnosis and Management of Patients with Thoracic Aortic
Disease. Circulation. 8000; 121: e266-e369.
2. Incidental aberrant right subclavian artery.
3. Small hiatal hernia.
4. Mild calcific atherosclerosis of the aorta.

By: Tyrus Tessier M.D.

## 2022-10-01 DIAGNOSIS — H2513 Age-related nuclear cataract, bilateral: Secondary | ICD-10-CM | POA: Diagnosis not present

## 2022-10-01 DIAGNOSIS — H40033 Anatomical narrow angle, bilateral: Secondary | ICD-10-CM | POA: Diagnosis not present

## 2023-03-10 DIAGNOSIS — H10013 Acute follicular conjunctivitis, bilateral: Secondary | ICD-10-CM | POA: Diagnosis not present

## 2023-03-20 DIAGNOSIS — Z0001 Encounter for general adult medical examination with abnormal findings: Secondary | ICD-10-CM | POA: Diagnosis not present

## 2023-08-02 ENCOUNTER — Other Ambulatory Visit: Payer: Self-pay

## 2023-08-02 ENCOUNTER — Emergency Department (HOSPITAL_COMMUNITY)

## 2023-08-02 ENCOUNTER — Encounter (HOSPITAL_COMMUNITY): Payer: Self-pay

## 2023-08-02 ENCOUNTER — Observation Stay (HOSPITAL_COMMUNITY)
Admission: EM | Admit: 2023-08-02 | Discharge: 2023-08-03 | Disposition: A | Discharge status: Home / Self Care | Attending: Internal Medicine | Admitting: Internal Medicine

## 2023-08-02 DIAGNOSIS — I4891 Unspecified atrial fibrillation: Secondary | ICD-10-CM | POA: Diagnosis present

## 2023-08-02 DIAGNOSIS — I6389 Other cerebral infarction: Secondary | ICD-10-CM

## 2023-08-02 DIAGNOSIS — H53461 Homonymous bilateral field defects, right side: Secondary | ICD-10-CM

## 2023-08-02 DIAGNOSIS — Z79899 Other long term (current) drug therapy: Secondary | ICD-10-CM | POA: Insufficient documentation

## 2023-08-02 DIAGNOSIS — Z7901 Long term (current) use of anticoagulants: Secondary | ICD-10-CM | POA: Diagnosis not present

## 2023-08-02 DIAGNOSIS — R262 Difficulty in walking, not elsewhere classified: Secondary | ICD-10-CM | POA: Diagnosis present

## 2023-08-02 DIAGNOSIS — I340 Nonrheumatic mitral (valve) insufficiency: Secondary | ICD-10-CM | POA: Insufficient documentation

## 2023-08-02 DIAGNOSIS — I10 Essential (primary) hypertension: Secondary | ICD-10-CM | POA: Insufficient documentation

## 2023-08-02 DIAGNOSIS — E669 Obesity, unspecified: Secondary | ICD-10-CM | POA: Diagnosis not present

## 2023-08-02 DIAGNOSIS — E7849 Other hyperlipidemia: Secondary | ICD-10-CM | POA: Insufficient documentation

## 2023-08-02 DIAGNOSIS — I63532 Cerebral infarction due to unspecified occlusion or stenosis of left posterior cerebral artery: Principal | ICD-10-CM | POA: Diagnosis present

## 2023-08-02 DIAGNOSIS — I700 Atherosclerosis of aorta: Secondary | ICD-10-CM | POA: Diagnosis not present

## 2023-08-02 DIAGNOSIS — I351 Nonrheumatic aortic (valve) insufficiency: Secondary | ICD-10-CM | POA: Insufficient documentation

## 2023-08-02 DIAGNOSIS — I639 Cerebral infarction, unspecified: Secondary | ICD-10-CM | POA: Diagnosis not present

## 2023-08-02 DIAGNOSIS — E785 Hyperlipidemia, unspecified: Secondary | ICD-10-CM | POA: Diagnosis present

## 2023-08-02 DIAGNOSIS — Z6833 Body mass index (BMI) 33.0-33.9, adult: Secondary | ICD-10-CM | POA: Insufficient documentation

## 2023-08-02 DIAGNOSIS — E66811 Obesity, class 1: Secondary | ICD-10-CM | POA: Diagnosis present

## 2023-08-02 DIAGNOSIS — I6622 Occlusion and stenosis of left posterior cerebral artery: Secondary | ICD-10-CM | POA: Diagnosis not present

## 2023-08-02 DIAGNOSIS — R519 Headache, unspecified: Secondary | ICD-10-CM | POA: Diagnosis not present

## 2023-08-02 DIAGNOSIS — I771 Stricture of artery: Secondary | ICD-10-CM | POA: Diagnosis not present

## 2023-08-02 DIAGNOSIS — I672 Cerebral atherosclerosis: Secondary | ICD-10-CM | POA: Diagnosis not present

## 2023-08-02 HISTORY — DX: Cerebral infarction due to unspecified occlusion or stenosis of left posterior cerebral artery: I63.532

## 2023-08-02 LAB — I-STAT CHEM 8, ED
BUN: 10 mg/dL (ref 8–23)
Calcium, Ion: 1.16 mmol/L (ref 1.15–1.40)
Chloride: 107 mmol/L (ref 98–111)
Creatinine, Ser: 1.2 mg/dL (ref 0.61–1.24)
Glucose, Bld: 122 mg/dL — ABNORMAL HIGH (ref 70–99)
HCT: 43 % (ref 39.0–52.0)
Hemoglobin: 14.6 g/dL (ref 13.0–17.0)
Potassium: 4.7 mmol/L (ref 3.5–5.1)
Sodium: 141 mmol/L (ref 135–145)
TCO2: 24 mmol/L (ref 22–32)

## 2023-08-02 LAB — ECHOCARDIOGRAM COMPLETE
Height: 72 in
S' Lateral: 2.8 cm
Weight: 3952 [oz_av]

## 2023-08-02 LAB — DIFFERENTIAL
Abs Immature Granulocytes: 0.03 10*3/uL (ref 0.00–0.07)
Basophils Absolute: 0.1 10*3/uL (ref 0.0–0.1)
Basophils Relative: 1 %
Eosinophils Absolute: 0.2 10*3/uL (ref 0.0–0.5)
Eosinophils Relative: 2 %
Immature Granulocytes: 0 %
Lymphocytes Relative: 17 %
Lymphs Abs: 1.4 10*3/uL (ref 0.7–4.0)
Monocytes Absolute: 0.6 10*3/uL (ref 0.1–1.0)
Monocytes Relative: 8 %
Neutro Abs: 5.8 10*3/uL (ref 1.7–7.7)
Neutrophils Relative %: 72 %

## 2023-08-02 LAB — RAPID URINE DRUG SCREEN, HOSP PERFORMED
Amphetamines: NOT DETECTED
Barbiturates: NOT DETECTED
Benzodiazepines: NOT DETECTED
Cocaine: NOT DETECTED
Opiates: NOT DETECTED
Tetrahydrocannabinol: NOT DETECTED

## 2023-08-02 LAB — URINALYSIS, ROUTINE W REFLEX MICROSCOPIC
Bilirubin Urine: NEGATIVE
Glucose, UA: NEGATIVE mg/dL
Hgb urine dipstick: NEGATIVE
Ketones, ur: NEGATIVE mg/dL
Leukocytes,Ua: NEGATIVE
Nitrite: NEGATIVE
Protein, ur: NEGATIVE mg/dL
Specific Gravity, Urine: >1.046 — ABNORMAL HIGH (ref 1.005–1.030)
pH: 5 (ref 5.0–8.0)

## 2023-08-02 LAB — PROTIME-INR
INR: 1.2 (ref 0.8–1.2)
Prothrombin Time: 14.9 s (ref 11.4–15.2)

## 2023-08-02 LAB — CBC
HCT: 44.2 % (ref 39.0–52.0)
Hemoglobin: 15.2 g/dL (ref 13.0–17.0)
MCH: 29.5 pg (ref 26.0–34.0)
MCHC: 34.4 g/dL (ref 30.0–36.0)
MCV: 85.7 fL (ref 80.0–100.0)
Platelets: 239 10*3/uL (ref 150–400)
RBC: 5.16 MIL/uL (ref 4.22–5.81)
RDW: 13.3 % (ref 11.5–15.5)
WBC: 8.1 10*3/uL (ref 4.0–10.5)
nRBC: 0 % (ref 0.0–0.2)

## 2023-08-02 LAB — COMPREHENSIVE METABOLIC PANEL
ALT: 23 U/L (ref 0–44)
AST: 21 U/L (ref 15–41)
Albumin: 3.9 g/dL (ref 3.5–5.0)
Alkaline Phosphatase: 42 U/L (ref 38–126)
Anion gap: 3 — ABNORMAL LOW (ref 5–15)
BUN: 7 mg/dL — ABNORMAL LOW (ref 8–23)
CO2: 24 mmol/L (ref 22–32)
Calcium: 8.9 mg/dL (ref 8.9–10.3)
Chloride: 111 mmol/L (ref 98–111)
Creatinine, Ser: 1.03 mg/dL (ref 0.61–1.24)
GFR, Estimated: >60 mL/min (ref >60)
Glucose, Bld: 130 mg/dL — ABNORMAL HIGH (ref 70–99)
Potassium: 4.7 mmol/L (ref 3.5–5.1)
Sodium: 138 mmol/L (ref 135–145)
Total Bilirubin: 0.7 mg/dL (ref 0.0–1.2)
Total Protein: 6.7 g/dL (ref 6.5–8.1)

## 2023-08-02 LAB — HEMOGLOBIN A1C
Hgb A1c MFr Bld: 5.3 % (ref 4.8–5.6)
Mean Plasma Glucose: 105.41 mg/dL

## 2023-08-02 LAB — ETHANOL: Alcohol, Ethyl (B): <10 mg/dL (ref <10)

## 2023-08-02 LAB — CBG MONITORING, ED: Glucose-Capillary: 125 mg/dL — ABNORMAL HIGH (ref 70–99)

## 2023-08-02 LAB — APTT: aPTT: 26 s (ref 24–36)

## 2023-08-02 MED ORDER — SODIUM CHLORIDE 0.9% FLUSH
3.0000 mL | Freq: Once | INTRAVENOUS | Status: AC
  Administered 2023-08-02: 3 mL via INTRAVENOUS

## 2023-08-02 MED ORDER — ONDANSETRON HCL 4 MG/2ML IJ SOLN: 4.0000 mg | Freq: Once | INTRAMUSCULAR | Status: DC

## 2023-08-02 MED ORDER — ONDANSETRON HCL 4 MG/2ML IJ SOLN
4.0000 mg | Freq: Once | INTRAMUSCULAR | Status: AC
  Administered 2023-08-02: 4 mg via INTRAVENOUS
  Filled 2023-08-02: qty 2

## 2023-08-02 MED ORDER — SODIUM CHLORIDE 0.9 % IV SOLN: INTRAVENOUS | Status: AC

## 2023-08-02 MED ORDER — ENOXAPARIN SODIUM 40 MG/0.4ML IJ SOSY
40.0000 mg | PREFILLED_SYRINGE | INTRAMUSCULAR | Status: DC
  Administered 2023-08-02 – 2023-08-03 (×2): 40 mg via SUBCUTANEOUS
  Filled 2023-08-02 (×2): qty 0.4

## 2023-08-02 MED ORDER — LORAZEPAM 0.5 MG PO TABS: 0.5000 mg | ORAL_TABLET | Freq: Once | ORAL | Status: DC | PRN

## 2023-08-02 MED ORDER — STROKE: EARLY STAGES OF RECOVERY BOOK: Freq: Once | Status: DC

## 2023-08-02 MED ORDER — STROKE: EARLY STAGES OF RECOVERY BOOK
Freq: Once | Status: AC
  Filled 2023-08-02: qty 1

## 2023-08-02 MED ORDER — LABETALOL HCL 5 MG/ML IV SOLN: 5.0000 mg | INTRAVENOUS | Status: DC | PRN

## 2023-08-02 MED ORDER — ASPIRIN 81 MG PO TBEC
81.0000 mg | DELAYED_RELEASE_TABLET | Freq: Every day | ORAL | Status: DC
  Administered 2023-08-02 – 2023-08-03 (×2): 81 mg via ORAL
  Filled 2023-08-02 (×2): qty 1

## 2023-08-02 MED ORDER — ACETAMINOPHEN 325 MG PO TABS
650.0000 mg | ORAL_TABLET | Freq: Four times a day (QID) | ORAL | Status: DC | PRN
  Administered 2023-08-02 – 2023-08-03 (×2): 650 mg via ORAL
  Filled 2023-08-02 (×2): qty 2

## 2023-08-02 MED ORDER — IOHEXOL 350 MG/ML SOLN
100.0000 mL | Freq: Once | INTRAVENOUS | Status: AC | PRN
  Administered 2023-08-02: 100 mL via INTRAVENOUS

## 2023-08-02 NOTE — Consult Note (Signed)
 NEUROLOGY CONSULT NOTE   Date of service: August 02, 2023 Patient Name: Anthony [name] MRN:  [id number] DOB:  27-May-1956 Chief Complaint: "CODE STROKE" Requesting Provider: Coral Spikes, DO  History of Present Illness  Anthony [name] is a 68 y.o. male with hx of Afib, HLD, GERD who presented to the ED with c/o severe headache, sweating, nausea, loss of right eye peripheral vision upon waking at 0430 this am. CODE STROKE was called in ED Triage.  On neurology assessment, patient had right hemianopia with complete field cut. He was oriented and had no focal weakness or sensory deficit.  Stat CTH negative for acute abnormality. CTA with perfusion showed left P3 occlusion. Discussion between Dr. Amada Jupiter and Dr. Corliss Skains determined that vessel was too distal to reach safely, with risks outweighing benefits of an IR procedure.   Patient has a known history of Afib, but is not on any anticoagulation. He has aspirin on his home medication list, but states he only takes that when he feels chest tightness. He denied any chest tightness, shortness of breath, dizziness, headache on our assessment. He was not clammy to the touch and was not sweating. He did complain of mild nausea.   LKW: 2200 3/4 Modified rankin score: 0-Completely asymptomatic and back to baseline post- stroke IV Thrombolysis: EVT:  No, risks outweigh benefits of procedure  NIHSS components Score: Comment  1a Level of Conscious 0[]  1[]  2[]  3[]      1b LOC Questions 0[]  1[]  2[]       1c LOC Commands 0[]  1[]  2[]       2 Best Gaze 0[]  1[]  2[]       3 Visual 0[]  1[x]  2[]  3[]     Loss of peripheral vision R   4 Facial Palsy 0[]  1[]  2[]  3[]      5a Motor Arm - left 0[]  1[]  2[]  3[]  4[]  UN[]    5b Motor Arm - Right 0[]  1[]  2[]  3[]  4[]  UN[]    6a Motor Leg - Left 0[]  1[]  2[]  3[]  4[]  UN[]    6b Motor Leg - Right 0[]  1[]  2[]  3[]  4[]  UN[]    7 Limb Ataxia 0[]  1[]  2[]  3[]  UN[]     8 Sensory 0[]  1[]  2[]  UN[]      9 Best Language 0[]  1[]  2[]  3[]       10 Dysarthria 0[]  1[]  2[]  UN[]      11 Extinct. and Inattention 0[]  1[]  2[]       TOTAL:   1      ROS   Comprehensive ROS performed and pertinent positives documented in HPI   Past History   Past Medical History:  Diagnosis Date   GERD (gastroesophageal reflux disease)    Hyperlipidemia     Past Surgical History:  Procedure Laterality Date   BACK SURGERY     disc decompression    Family History: Family History  Problem Relation Age of Onset   Lung cancer Father    Heart attack Maternal Grandfather     Social History  reports that he has never smoked. He has never used smokeless tobacco. He reports that he does not drink alcohol and does not use drugs.  No Known Allergies  Medications  No current facility-administered medications for this encounter.  Current Outpatient Medications:    aspirin EC 81 MG tablet, Take 81 mg by mouth daily., Disp: , Rfl:    atorvastatin (LIPITOR) 40 MG tablet, TAKE 1 TABLET DAILY., Disp: 30 tablet, Rfl: 6   metoprolol tartrate (LOPRESSOR) 25 MG tablet, Take  1.5 tablets (37.5 mg total) by mouth 2 (two) times daily., Disp: 270 tablet, Rfl: 1   Multiple Vitamins-Minerals (ONE DAILY MULTIVITAMIN MEN) TABS, Take 1 tablet by mouth. Occasionally, Disp: , Rfl:   Vitals   Vitals:   08/21/23 0649 08-21-2023 0653  BP: (!) 150/98   Pulse: 81   Resp: 18   Temp: 97.7 F (36.5 C)   SpO2: 97%   Weight:  112 kg  Height:  6' (1.829 m)    Body mass index is 33.5 kg/m.  Physical Exam   Constitutional: Appears well-developed and well-nourished.  Psych: Affect appropriate to situation.  Eyes: No scleral injection.  HENT: No OP obstruction.  Head: Normocephalic.  Cardiovascular: Irregular rate and rhythm on monitor Respiratory: Effort normal, non-labored breathing.  GI: Soft.  No distension. There is no tenderness.  Skin: WDI.   Neurologic Examination   Neuro: Mental Status: Patient is awake, alert, oriented to person, place, month, year,  and situation. Patient is able to give a clear and coherent history. Speech/Language: No dysarthria.  No signs of aphasia or neglect Cranial Nerves: II: Pupils are equal, round, and reactive to light.   Right hemianopia, field cut of right peripheral vision.  III,IV, VI: EOMI without ptosis or diploplia.  V: Facial sensation is symmetric to temperature VII: Facial movement is symmetric.  VIII: hearing is intact to voice X: Uvula elevates symmetrically XI: Shoulder shrug is symmetric. XII: tongue is midline without atrophy or fasciculations.  Motor: Tone is normal. Bulk is normal. 5/5 strength was present in all four extremities.  Sensory: Sensation is symmetric to light touch and temperature in the arms and legs. Cerebellar: FNF and HKS are intact bilaterally   Labs/Imaging/Neurodiagnostic studies   CBC:  Recent Labs  Lab 2023-08-21 0659 08/21/23 0701  WBC 8.1  --   NEUTROABS 5.8  --   HGB 15.2 14.6  HCT 44.2 43.0  MCV 85.7  --   PLT 239  --    Basic Metabolic Panel:  Lab Results  Component Value Date   NA 141 08/21/23   K 4.7 Aug 21, 2023   CO2 24 08/21/2023   GLUCOSE 122 (H) Aug 21, 2023   BUN 10 08/21/2023   CREATININE 1.20 08/21/2023   CALCIUM 8.9 08-21-2023   GFRNONAA >60 August 21, 2023   GFRAA 79 (L) 07/26/2014   Lipid Panel:  Lab Results  Component Value Date   LDLCALC 169 (H) 07/27/2014   HgbA1c:  Lab Results  Component Value Date   HGBA1C 5.5 07/26/2014   Urine Drug Screen: No results found for: "LABOPIA", "COCAINSCRNUR", "LABBENZ", "AMPHETMU", "THCU", "LABBARB"  Alcohol Level     Component Value Date/Time   ETH <10 2023/08/21 0659   INR  Lab Results  Component Value Date   INR 1.2 08/21/23   APTT  Lab Results  Component Value Date   APTT 26 08/21/23   AED levels: No results found for: "PHENYTOIN", "ZONISAMIDE", "LAMOTRIGINE", "LEVETIRACETA"  CT Head without contrast(Personally reviewed): No acute abnormality Calcified  atherosclerosis at skull base Aspects 10  CT angio Head and Neck with contrast with perfusion(Personally reviewed): Left PCA P3 occlusion 20 mL Oligemia in the Left PCA territory.  NO other LVO  MRI Brain(Personally reviewed): PENDING  ASSESSMENT   Anthony [name] is a 68 y.o. male with hx of Afib, HLD, GERD who presented d/t severe headache, sweating, nausea, loss of right eye peripheral vision upon waking at 0430 this am. CODE STROKE was called in ED Triage. On assessment, patient had  right hemianopia, loss of peripheral vision. He was oriented and had no focal weakness or sensory deficit.  Stat CTH negative for acute abnormality. CTA with perfusion showed left P3 occlusion. Discussion between Dr. Amada Jupiter and Dr. Corliss Skains determined that vessel was too distal to reach safely, with risks outweighing benefits of an IR procedure.   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  Etiology: likely due to history of Atrial Fibrillation, not on anticoagulation--pending full stroke workup.   RECOMMENDATIONS  - Frequent Neuro checks per stroke unit protocol - MRI Brain stroke protocol - TTE - Lipid panel - Statin - will be started if LDL>70 or otherwise medically indicated - A1C - Antithrombotic - only Aspirin 81mg  pending EKG, echo and possible start on anticoagulation for Afib per stroke team.  - DVT ppx - lovenox - Smoking cessation - will counsel patient - SBP goal - <220, PRN labetalol if HR>60 and PRN Hydralazine if HR<60 - Telemetry monitoring for arrhythmia - 72h - Swallow screen - will be performed prior to PO intake - Stroke education - will be given - PT/OT/SLP - Dispo: admit for stroke work-up   Stroke team will follow beginning 3/6 am.   ______________________________________________________________________   Signed, Lynnae January, NP [facility]   I have seen the patient and reviewed the above note.  He presents with a  wake-up stroke, woke up around 4:30 AM and is therefore already been symptomatic over 3 hours.  He is unfortunate outside the window for IV TNK from last known well (midnight) and CT perfusion shows a distal occlusion that is too distal to treat.  He will need to be admitted for secondary risk factor modification, but suspect he will need to be on anticoagulation over the long-term.  Stroke team to follow.  Ritta Slot, MD [facility]   If 7pm- 7am, please page neurology on call as listed in AMION.

## 2023-08-02 NOTE — Evaluation (Signed)
 Occupational Therapy Evaluation Patient Details Name: Anthony [name] MRN: [id number] DOB: Dec 04, 1955 Today's Date: 08/02/2023   History of Present Illness   68 year old male adm 3/5 presenting with headache and loss of right visual field.  Patient also complained of lower extermity weakness.  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 Minor involvement of the posterior left thalamus.     Clinical Impressions Patient admitted for the diagnosis above.  PTA he lives at home with his spouse, who can provide any needed assist.  Primary deficit is loss of R visual field.  Patient is scanning well and was able to walk the crowded ED halls without incident.  OT will follow in the acute setting to monitor, but no post acute OT is anticipated.  Recommend follow up eye appointment.  Patient is having difficulty with ST Memory, he is repeating similar questions which the son said is new.  ST and PT Consult Pending.       If plan is discharge home, recommend the following:   Assist for transportation     Functional Status Assessment   Patient has had a recent decline in their functional status and demonstrates the ability to make significant improvements in function in a reasonable and predictable amount of time.     Equipment Recommendations   None recommended by OT     Recommendations for Other Services         Precautions/Restrictions   Precautions Precautions: Fall Recall of Precautions/Restrictions: Intact Restrictions Weight Bearing Restrictions Per Provider Order: No     Mobility Bed Mobility Overal bed mobility: Independent                  Transfers Overall transfer level: Modified independent Equipment used: None               General transfer comment: Normal pace, able to scan well      Balance Overall balance assessment: No apparent balance deficits (not formally assessed)                                          ADL either performed or assessed with clinical judgement   ADL Overall ADL's : At baseline                                             Vision Ability to See in Adequate Light: 1 Impaired Patient Visual Report: Peripheral vision impairment Additional Comments: Patient is missing entire R visual field.  Picks up objects at midline.     Perception Perception: Within Functional Limits       Praxis Praxis: WFL       Pertinent Vitals/Pain Pain Assessment Pain Assessment: No/denies pain     Extremity/Trunk Assessment Upper Extremity Assessment Upper Extremity Assessment: Overall WFL for tasks assessed   Lower Extremity Assessment Lower Extremity Assessment: Defer to PT evaluation   Cervical / Trunk Assessment Cervical / Trunk Assessment: Normal   Communication Communication Communication: No apparent difficulties   Cognition Arousal: Alert Behavior During Therapy: WFL for tasks assessed/performed Cognition: Cognition impaired       Memory impairment (select all impairments): Short-term memory     OT - Cognition Comments: Asking the same questions - son states  this is new since today.  Follows commands well, and is aware of visual deficits.  Scanning the environment well.                 Following commands: Intact       Cueing  General Comments   Cueing Techniques: Verbal cues      Exercises     Shoulder Instructions      Home Living Family/patient expects to be discharged to:: Private residence Living Arrangements: Spouse/significant other Available Help at Discharge: Family;Available 24 hours/day Type of Home: House Home Access: Stairs to enter [employer] of Steps: 7 Entrance Stairs-Rails: Right;Left;Can reach both Home Layout: One level     Bathroom Shower/Tub: Chief Strategy Officer: Standard Bathroom Accessibility: Yes How Accessible: Accessible via  walker Home Equipment: None          Prior Functioning/Environment Prior Level of Function : Independent/Modified Independent;Working/employed;Driving               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 Problem List: Impaired vision/perception   OT Treatment/Interventions: Environmental manager;Therapeutic activities;Visual/perceptual remediation/compensation      OT Goals(Current goals can be found in the care plan section)   Acute Rehab OT Goals Patient Stated Goal: Return home OT Goal Formulation: With patient Time For Goal Achievement: 08/16/23 Potential to Achieve Goals: Good ADL Goals Pt Will Perform Grooming: Independently Pt Will Transfer to Toilet: Independently   OT Frequency:  Min 1X/week    Co-evaluation              AM-PAC OT "6 Clicks" Daily Activity     Outcome Measure Help from another person eating meals?: None Help from another person taking care of personal grooming?: None Help from another person toileting, which includes using toliet, bedpan, or urinal?: None Help from another person bathing (including washing, rinsing, drying)?: None Help from another person to put on and taking off regular upper body clothing?: None Help from another person to put on and taking off regular lower body clothing?: None 6 Click Score: 24   End of Session Equipment Utilized During Treatment: Gait belt Nurse Communication: Mobility status  Activity Tolerance: Patient tolerated treatment well Patient left: in bed;with call bell/phone within reach;with family/visitor present  OT Visit Diagnosis: Low vision, both eyes (H54.2)                Time: 4098-1191 OT Time Calculation (min): 27 min Charges:  OT Evaluation $OT Eval Low Complexity: 1 Low OT Treatments $Self Care/Home Management : 8-22 mins  08/02/2023  RP, OTR/L  Acute Rehabilitation Services  Office:  [phone]   Suzanna Obey 08/02/2023, 3:25 PM

## 2023-08-02 NOTE — ED Provider Notes (Signed)
 [place] EMERGENCY DEPARTMENT AT [facility] Provider Note   CSN: [id number] Arrival date & time: 08/02/23  0865     History  Chief Complaint  Patient presents with   Visual Field Change    Anthony [name] is a 68 y.o. male.  This is a 68 year old male presenting emergency department for headache and loss of right visual field.  Last known normal 10 PM last night bed.  Awoke this morning with headache and unable to see out of right.  Also had some balance disturbance.  No facial droop, aphasia, unilateral weakness.        Home Medications Prior to Admission medications   Medication Sig Start Date End Date Taking? Authorizing Provider  aspirin EC 81 MG tablet Take 81 mg by mouth daily. Patient not taking: Reported on 08/02/2023    [provider]  atorvastatin (LIPITOR) 40 MG tablet TAKE 1 TABLET DAILY. Patient not taking: Reported on 08/02/2023 10/31/17   Antoine Poche, MD  metoprolol tartrate (LOPRESSOR) 25 MG tablet Take 1.5 tablets (37.5 mg total) by mouth 2 (two) times daily. Patient not taking: Reported on 08/02/2023 09/06/17 04/06/18  Antoine Poche, MD      Allergies    Patient has no known allergies.    Review of Systems   Review of Systems  Physical Exam Updated Vital Signs BP (!) 146/92   Pulse 96   Temp 97.7 F (36.5 C)   Resp 13   Ht 6' (1.829 m)   Wt 112 kg   SpO2 100%   BMI 33.50 kg/m  Physical Exam Vitals and nursing note reviewed.  Constitutional:      General: He is not in acute distress.    Appearance: He is not toxic-appearing.  HENT:     Head: Normocephalic.     Nose: Nose normal.     Mouth/Throat:     Mouth: Mucous membranes are moist.  Eyes:     Extraocular Movements: Extraocular movements intact.     Conjunctiva/sclera: Conjunctivae normal.     Pupils: Pupils are equal, round, and reactive to light.  Cardiovascular:     Rate and Rhythm: Normal rate and regular rhythm.  Pulmonary:     Effort: Pulmonary effort  is normal.     Breath sounds: Normal breath sounds.  Abdominal:     General: Abdomen is flat. There is no distension.     Tenderness: There is no abdominal tenderness. There is no guarding or rebound.  Musculoskeletal:        General: Normal range of motion.  Skin:    General: Skin is warm.  Neurological:     Mental Status: He is alert and oriented to person, place, and time.     Comments: Loss of right visual field.  No facial droop.  Printers otherwise preserved.  No unilateral weakness.  No sensorium changes.  Coordinated movements.  Psychiatric:        Mood and Affect: Mood normal.        Behavior: Behavior normal.     ED Results / Procedures / Treatments   Labs (all labs ordered are listed, but only abnormal results are displayed) Labs Reviewed  COMPREHENSIVE METABOLIC PANEL - Abnormal; Notable for the following components:      Result Value   Glucose, Bld 130 (*)    BUN 7 (*)    Anion gap 3 (*)    All other components within normal limits  CBG MONITORING, ED - Abnormal;  Notable for the following components:   Glucose-Capillary 125 (*)    All other components within normal limits  I-STAT CHEM 8, ED - Abnormal; Notable for the following components:   Glucose, Bld 122 (*)    All other components within normal limits  PROTIME-INR  APTT  CBC  DIFFERENTIAL  ETHANOL  HEMOGLOBIN A1C  RAPID URINE DRUG SCREEN, HOSP PERFORMED  URINALYSIS, ROUTINE W REFLEX MICROSCOPIC  CBG MONITORING, ED    EKG EKG Interpretation Date/Time:  Wednesday August 02 2023 07:28:05 EST Ventricular Rate:  92 PR Interval:    QRS Duration:  96 QT Interval:  356 QTC Calculation: 441 R Axis:   63  Text Interpretation: Atrial fibrillation Confirmed by Estanislado Pandy [phone]) on 08/02/2023 8:10:39 AM  Radiology CT ANGIO HEAD NECK W WO CM W PERF (CODE STROKE) Result Date: 08/02/2023 CLINICAL DATA:  68 year old male code stroke presentation. EXAM: CT ANGIOGRAPHY HEAD AND NECK CT PERFUSION BRAIN  TECHNIQUE: Multidetector CT imaging of the head and neck was performed using the standard protocol during bolus administration of intravenous contrast. Multiplanar CT image reconstructions and MIPs were obtained to evaluate the vascular anatomy. Carotid stenosis measurements (when applicable) are obtained utilizing NASCET criteria, using the distal internal carotid diameter as the denominator. Multiphase CT imaging of the brain was performed following IV bolus contrast injection. Subsequent parametric perfusion maps were calculated using RAPID software. RADIATION DOSE REDUCTION: This exam was performed according to the departmental dose-optimization program which includes automated exposure control, adjustment of the mA and/or kV according to patient size and/or use of iterative reconstruction technique. CONTRAST:  OMNIPAQUE IOHEXOL 350 MG/ML SOLN COMPARISON:  Plain head CT 0708 hours today. FINDINGS: CT Brain Perfusion Findings: ASPECTS: 10 CBF (<30%) Volume: 0mL.  No CBV parameter abnormality. Perfusion (Tmax>6.0s) volume: 20mL.  Hypoperfusion index of zero. Mismatch Volume: 20mL Infarction Location:Left PCA territory. CTA NECK Skeleton: Absent maxillary dentition. Cervical spine degeneration. No acute osseous abnormality identified. Upper chest: Negative upper lungs and superior mediastinum. Other neck: Neck soft tissue spaces are within normal limits. Aortic arch: Aberrant origin right subclavian artery. Calcified aortic atherosclerosis. 4 vessel arch configuration. Right carotid system: Mildly tortuous right CCA. Patent right carotid bifurcation. Tortuous right ICA distal to the bulb. Minimal atherosclerosis and no stenosis. Left carotid system: Mildly tortuous left CCA with no plaque or stenosis. Patent left carotid bifurcation. Soft plaque at the left ICA distal ball suspected (series 9, image 28) but no significant stenosis to the skull base. Vertebral arteries: Aberrant origin right subclavian artery  with mild plaque and no stenosis. Normal right vertebral artery origin. Right vertebral artery patent and within normal limits to the skull base. Proximal left subclavian soft and calcified plaque without stenosis. Left vertebral artery origin remains normal. Tortuous left V1 segment. Fairly codominant left vertebral artery is patent to the skull base with no significant plaque or stenosis. CTA HEAD Posterior circulation: Distal vertebral arteries and vertebrobasilar junction are patent. Both PICA origins are patent. There is bilateral distal V4 segment, vertebrobasilar junction mild plaque but no significant stenosis (series 8, image 30). Patent mildly tortuous basilar artery without stenosis. Patent SCA and PCA origins. Posterior communicating arteries are diminutive or absent. Right PCA branches are within normal limits. Left PCA P3 superior division is occluded on series 9, image 24. No left P1 or P2 segment irregularity. Anterior circulation: Both ICA siphons are patent. Minimal right siphon calcified plaque and no stenosis. Mild but more pronounced left siphon, mostly supraclinoid segment calcified plaque with  no significant stenosis. Patent carotid termini, MCA and ACA origins. Normal anterior communicating artery, bilateral ACA branches. Left MCA M1 segment and bifurcation are patent without stenosis. Right MCA M1 segment and bifurcation are patent without stenosis. Bilateral MCA branches are within normal limits. Venous sinuses: Early contrast timing, grossly patent. Anatomic variants: None significant. Review of the MIP images confirms the above findings IMPRESSION: 1. Positive for Left PCA P3 Occlusion (superior division) with concordant CTP detecting 20 mL Oligemia in the Left PCA territory. No infarct core detected by the standard parameter. These results were communicated to Dr. Amada Jupiter at 0730 hours on 08/02/2023 by text page via the [facility] messaging system. 2. No other large vessel occlusion. No  other significant posterior circulation stenosis. Mild for age atherosclerosis by CTA in the neck and at the skull base. 3. Aberrant origin right subclavian artery. Aortic Atherosclerosis (ICD10-I70.0). Electronically Signed   By: Odessa Fleming M.D.   On: 08/02/2023 07:38   CT HEAD CODE STROKE WO CONTRAST Result Date: 08/02/2023 CLINICAL DATA:  Code stroke. 67 year old male right side vision loss and vomiting. EXAM: CT HEAD WITHOUT CONTRAST TECHNIQUE: Contiguous axial images were obtained from the base of the skull through the vertex without intravenous contrast. RADIATION DOSE REDUCTION: This exam was performed according to the departmental dose-optimization program which includes automated exposure control, adjustment of the mA and/or kV according to patient size and/or use of iterative reconstruction technique. COMPARISON:  None Available. FINDINGS: Brain: Cerebral volume is within normal limits for age. No midline shift, mass effect, or evidence of intracranial mass lesion. No ventriculomegaly. Gray-white differentiation maintained bilaterally, within normal limits for age throughout the brain. No encephalomalacia identified. Vascular: Calcified atherosclerosis at the skull base. No suspicious intracranial vascular hyperdensity. Skull: No acute osseous abnormality identified. Sinuses/Orbits: Visualized paranasal sinuses and mastoids are absent maxillary dentition. Well aerated. Other: Orbit and scalp soft tissues are within normal limits. ASPECTS [facility] Stroke Program Early CT Score) Total score (0-10 with 10 being normal): 10 IMPRESSION: Calcified atherosclerosis at the skull base but normal for age noncontrast CT appearance of the brain. ASPECTS 10. These results were communicated to Dr. Amada Jupiter at 7:16 am on 08/02/2023 by text page via the [facility] messaging system. Electronically Signed   By: Odessa Fleming M.D.   On: 08/02/2023 07:16    Procedures Procedures    Medications Ordered in ED Medications   stroke:  early stages of recovery book (has no administration in time range)  aspirin EC tablet 81 mg (has no administration in time range)  LORazepam (ATIVAN) tablet 0.5 mg (has no administration in time range)  sodium chloride flush (NS) 0.9 % injection 3 mL (3 mLs Intravenous Given 08/02/23 0728)  iohexol (OMNIPAQUE) 350 MG/ML injection 100 mL (100 mLs Intravenous Contrast Given 08/02/23 3474)    ED Course/ Medical Decision Making/ A&P Clinical Course as of 08/02/23 0827  Wed Aug 02, 2023  0715 Code stroke activated by triage.  Last known normal 9:00 when he went to bed.  Awoke this morning with right visual field loss.  CT head without intracranial hemorrhage on my independent interpretation.  Neurology presented to bedside/CT scanner and requesting CTA. [TY]  [id number] CT HEAD CODE STROKE WO CONTRAST IMPRESSION: Calcified atherosclerosis at the skull base but normal for age noncontrast CT appearance of the brain. ASPECTS 10.  These results were communicated to Dr. Amada Jupiter at 7:16 am on 08/02/2023 by text page via the [facility] messaging system.   [TY]  0746 CT ANGIO HEAD  NECK W WO CM W PERF (CODE STROKE) IMPRESSION: 1. Positive for Left PCA P3 Occlusion (superior division) with concordant CTP detecting 20 mL Oligemia in the Left PCA territory. No infarct core detected by the standard parameter. These results were communicated to Dr. Amada Jupiter at 0730 hours on 08/02/2023 by text page via the [facility] messaging system.  2. No other large vessel occlusion. No other significant posterior circulation stenosis. Mild for age atherosclerosis by CTA in the neck and at the skull base.  3. Aberrant origin right subclavian artery. Aortic Atherosclerosis (ICD10-I70.0).   Electronically Signed   By: Odessa Fleming M.D.   On: 08/02/2023 07:38   [TY]    Clinical Course User Index [TY] Coral Spikes, DO                                 Medical Decision Making This is a 68 year old male with history of  hyperlipidemia and GERD presented to the emergency department with headache and right visual field loss.  Code stroke activated in triage.  CT head negative, CTA showed left PCA P3 occlusion.  Outside of the window for thrombolytics.  Neurology recommending MRI and admission to medicine for stroke workup.  Recommending aspirin monotherapy.  Patient's other labs reassuring no leukocytosis or anemia.  Coags within normal limits.  No significant metabolic derangements.  EKG appears to be in atrial fibrillation.  Wife notes that he has a history of the same, but stopped taking medications for it.  He denied that he was on anticoagulants however.  Will admit for stroke workup and further management of patient's A-fib.  Amount and/or Complexity of Data Reviewed Labs: ordered. Radiology: ordered. Decision-making details documented in ED Course.  Risk Decision regarding hospitalization.         Final Clinical Impression(s) / ED Diagnoses Final diagnoses:  Cerebrovascular accident (CVA), unspecified mechanism [facility])    Rx / DC Orders ED Discharge Orders     None         Coral Spikes, DO 08/02/23 0827

## 2023-08-02 NOTE — ED Triage Notes (Signed)
 Patient went to bed at 10pm last night and woke up this am with severe headache, sweating nausea and loss of right eye peripheral vision.  Woke up at 0400 this am and reported having difficulty walking on the right side.   No weakness noted but loss of perphiral vision in right eye.

## 2023-08-02 NOTE — Code Documentation (Signed)
 Stroke Response Nurse Documentation Code Documentation  SHUAN STATZER is a 68 y.o. male arriving to [facility]  via [employer] on 3/5 with past medical hx of htn, afib. On No antithrombotic other than occasionally taking an 81mg  aspirin. Code stroke was activated by ED.   Patient from home where he was LKW at 2200 before bed and now complaining of R peripheral vision loss on waking.    Stroke team at the bedside on patient arrival. Labs drawn and patient cleared for CT by Dr. Pilar Plate. Patient to CT with team. NIHSS 1, see documentation for details and code stroke times. Patient with right hemianopia on exam. The following imaging was completed:  CT Head and CTA. Patient is not a candidate for IV Thrombolytic due to LKW 2200. Patient is not a candidate for IR due to occlusion too distal.   Care Plan: q2 NIHSS and vitals.   Bedside handoff with ED RN Delorise Jackson.    Pearlie Oyster  Stroke Response RN

## 2023-08-02 NOTE — ED Provider Notes (Signed)
 Emergency Medicine Provider Triage Evaluation Note  Anthony [name] , a 68 y.o. male  was evaluated in triage.  Pt complains of visual field cut, severe headache, last known well 10 PM..  Review of Systems  Positive: Visual field cut, headache Negative: Numbness or weakness to the arms or leg  Physical Exam  BP (!) 150/98 (BP Location: Right Arm)   Pulse 81   Temp 97.7 F (36.5 C)   Resp 18   Ht 6' (1.829 m)   Wt 112 kg   SpO2 97%   BMI 33.50 kg/m  Gen:   Awake, no distress   Resp:  Normal effort  MSK:   Moves extremities without difficulty  Other:  Visual field cut  Medical Decision Making  Medically screening exam initiated at 7:13 AM.  Appropriate orders placed.  Anthony [name] was informed that the remainder of the evaluation will be completed by another provider, this initial triage assessment does not replace that evaluation, and the importance of remaining in the ED until their evaluation is complete.  Code stroke initiated, CT Noncon concerning for intracranial bleeding.  Oncoming EDP informed.   Anthony Sous, MD 08/02/23 [phone]

## 2023-08-02 NOTE — Progress Notes (Signed)
 Echocardiogram 2D Echocardiogram has been performed.  Warren Lacy Rhyse Loux RDCS 08/02/2023, 12:11 PM

## 2023-08-02 NOTE — H&P (Signed)
 Date: 08/02/2023               Patient Name:  Anthony [name] MRN: [id number]  DOB: 1956-04-07 Age / Sex: 68 y.o., male   PCP: Lianne Moris, PA-C         Medical Service: Internal Medicine Teaching Service         Attending Physician: Dr. Mayford Knife, Dorene Ar, MD      First Contact: Dr. Morrie Sheldon, MD Pager [phone]    Second Contact: Dr. Rudene Christians, DO Pager [phone]         After Hours (After 5p/  First Contact Pager: [phone]  weekends / holidays): Second Contact Pager: [phone]   SUBJECTIVE   Chief Complaint: loss of right visual field  History of Present Illness:  Anthony [name] is a 68 year old with PMH of atrial fibrillation, HLD, GERD who presents with headache, dizziness and right peripheral vision loss.  He woke up around 430 this morning to get ready for work and felt very dizzy and sweaty at that time.  He tried to get up to go to the bathroom and had difficulty.  He showered and then his wife brought him to the hospital.  He does note about 6 months ago he had blurry vision in his right eye that resolved on its own while he was driving.  He does have a primary care doctor is prescribed some medications but not does not take them.  In terms of his atrial fibrillation he does have episodes where he has palpitations, happens infrequently.  He denies shortness of breath and chest pain.  ED Course: Code stroke activated.  He was noted to have right hemianopsia with complete field cut.  He was oriented and no focal weakness.  CT head negative.  CTA with perfusion showed left PCA occlusion.  Neurology and IR determined that vessel was too distal to reach safely and risk outweigh benefits for IR intervention.  Past Medical History Atrial fibrillation Hypertension Hyperlipidemia GERD  Past Surgical History:  Procedure Laterality Date   BACK SURGERY     disc decompression    Meds:  He does not take medications at home  Has been prescribed aspirin, atorvastatin and  metoprolol in the past.  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   OBJECTIVE:   Physical Exam: Blood pressure (!) 139/95, pulse 71, temperature 97.7 F (36.5 C), resp. rate 11, height 6' (1.829 m), weight 112 kg, SpO2 100%.  Constitutional: well-appearing, in no acute distress Cardiovascular: Irregularly irregular, no murmurs Pulmonary/Chest: normal work of breathing on room air, lungs clear to auscultation bilaterally MSK: No lower extremity edema Mental Status: Patient is awake, alert, oriented x3 No signs of aphasia or neglect Cranial Nerves: II: Visual field testing with right hemianopia III,IV, VI: EOMI without ptosis or diploplia.  V: Facial sensation is symmetric to light touch and temperature. VII: Facial movement is symmetric.  VIII: Hearing is intact to voice X: Uvula elevates symmetrically XI: Shoulder shrug is symmetric. XII: Tongue is midline without atrophy or fasciculations.  Motor: good effort thorughout, at least 5/5 bilateral  UE, 5/5 bilateral LE Sensory: Sensation is grossly intact bilateral UE & LE Cerebellar: Finger-Nose and Heel-Shin intact bilaterally   Labs: CBC    Component Value Date/Time   WBC 8.1 08/02/2023 0659   RBC 5.16 08/02/2023 0659   HGB 14.6 08/02/2023 0701   HCT 43.0 08/02/2023 0701   PLT 239 08/02/2023 0659   MCV 85.7 08/02/2023 0659   MCH 29.5 08/02/2023 0659   MCHC 34.4 08/02/2023 0659   RDW 13.3 08/02/2023 0659   LYMPHSABS 1.4 08/02/2023 0659   MONOABS 0.6 08/02/2023 0659   EOSABS 0.2 08/02/2023 0659   BASOSABS 0.1 08/02/2023 0659      CMP     Component Value Date/Time   NA 141 08/02/2023 0701   K 4.7 08/02/2023 0701   CL 107 08/02/2023 0701   CO2 24 08/02/2023 0659   GLUCOSE 122 (H) 08/02/2023 0701   BUN 10 08/02/2023 0701   CREATININE 1.20 08/02/2023 0701   CALCIUM 8.9 08/02/2023 0659   PROT 6.7 08/02/2023 0659   ALBUMIN 3.9 08/02/2023 0659   AST 21 08/02/2023 0659   ALT 23 08/02/2023 0659   ALKPHOS 42 08/02/2023 0659   BILITOT 0.7 08/02/2023 0659   GFRNONAA >60 08/02/2023 0659   GFRAA 79 (L) 07/26/2014 1716    Imaging: CT head IMPRESSION: Calcified atherosclerosis at the skull base but normal for age noncontrast CT appearance of the brain. ASPECTS 10.  CTA head neck IMPRESSION: 1. Positive for Left PCA P3 Occlusion (superior division) with concordant CTP detecting 20 mL Oligemia in the Left PCA territory. No infarct core detected by the standard parameter. These results were communicated to Dr. Amada Jupiter at 0730 hours on 08/02/2023 by text page via the [facility] messaging system.   2. No other large vessel occlusion. No other significant posterior circulation stenosis. Mild for age atherosclerosis by CTA in the neck and at the skull base.   3. Aberrant origin right subclavian artery. Aortic Atherosclerosis (ICD10-I70.0).  EKG: Atrial fibrillation with controlled rates  ASSESSMENT & PLAN:   Assessment & Plan by Problem: Principal Problem:   Acute CVA (cerebrovascular accident) (HCC)   Anthony [name] is a 68 y.o. person living with a history of atrial fibrillation, hyperlipidemia, GERD who presented with right peripheral vision loss and dizziness and admitted for left PCA acute cerebrovascular accident on hospital day 0  Acute left PCA stroke Hyperlipidemia Hypertension Patient presenting with dizziness and vision changes this morning when he woke up around 430.  When he went to sleep last night though symptoms were present.  CT angio head and neck showed PCA occlusion that unfortunately  could not be intervened on.  MRI confirmed acute stroke of left PCA territory affecting the left medial up to the middle lobe.  Noted minor involvement of the posterior left thalamus.  With his history of atrial fibrillation not on anticoagulation because concerns of this is embolic.  CHA2DS2-VASc now at 4 with new stroke.  He had an echocardiogram today that showed EF 55 to 60% with no regional wall motion abnormalities.  There was noted mild mitral valve regurgitation.  In terms of modifiable risk factors we will check a lipid panel tomorrow.  His A1c was not consistent with prediabetes or diabetes.  He needs to be evaluated by stroke team tomorrow and physical therapy/speech therapy.  -Stroke team to follow-up 3/6 -Started aspirin 81 mg.  Will likely be started on anticoagulation for A-fib tomorrow with CHA2DS2-VASc being 4 -Neurochecks -Lipid panel -DVT prophylaxis with Lovenox -  Systolic goal less than 220, as needed labetalol ordered -Telemetry -Regular diet added back after swallow screen completed -PT/OT  Atrial fibrillation He was seen by cardiology in the past with A-fib.  Last office visit was in November 2019 with Dr. Wyline Mood.  At that time he was was not started on anticoagulation his CHA2DS2-VASc was 0.  He was on metoprolol 25 mg.  Rates are currently controlled between 70s to 90s.  -Please start DOAC tomorrow per stroke team, need to check for insurance coverage of this medication  Mild aortic insufficiency Mild mitral regurg Valvular abnormalities have been present on echoes dating back to 2019.  He previously saw cardiology in the past for this.  Diet: Normal VTE: Enoxaparin IVF: None,None Code: Full  Prior to Admission Living Arrangement: Home, living wife Anticipated Discharge Location: Home Barriers to Discharge: stroke evaluation  Dispo: Admit patient to Inpatient with expected length of stay greater than 2 midnights.  Signed: Rudene Christians, DO Internal  Medicine Resident PGY-3  08/02/2023, 3:54 PM

## 2023-08-03 ENCOUNTER — Other Ambulatory Visit (HOSPITAL_COMMUNITY): Payer: Self-pay

## 2023-08-03 DIAGNOSIS — I63532 Cerebral infarction due to unspecified occlusion or stenosis of left posterior cerebral artery: Secondary | ICD-10-CM | POA: Diagnosis not present

## 2023-08-03 DIAGNOSIS — E785 Hyperlipidemia, unspecified: Secondary | ICD-10-CM | POA: Diagnosis not present

## 2023-08-03 DIAGNOSIS — I4891 Unspecified atrial fibrillation: Secondary | ICD-10-CM

## 2023-08-03 DIAGNOSIS — H53461 Homonymous bilateral field defects, right side: Secondary | ICD-10-CM

## 2023-08-03 LAB — LIPID PANEL
Cholesterol: 247 mg/dL — ABNORMAL HIGH (ref 0–200)
HDL: 38 mg/dL — ABNORMAL LOW (ref >40)
LDL Cholesterol: 181 mg/dL — ABNORMAL HIGH (ref 0–99)
Total CHOL/HDL Ratio: 6.5 ratio
Triglycerides: 138 mg/dL (ref <150)
VLDL: 28 mg/dL (ref 0–40)

## 2023-08-03 LAB — HIV ANTIBODY (ROUTINE TESTING W REFLEX): HIV Screen 4th Generation wRfx: NONREACTIVE

## 2023-08-03 MED ORDER — ATORVASTATIN CALCIUM 80 MG PO TABS
80.0000 mg | ORAL_TABLET | Freq: Every day | ORAL | Status: DC
  Administered 2023-08-03: 80 mg via ORAL
  Filled 2023-08-03: qty 1

## 2023-08-03 MED ORDER — METOPROLOL TARTRATE 25 MG PO TABS
37.5000 mg | ORAL_TABLET | Freq: Two times a day (BID) | ORAL | 0 refills | Status: DC
  Filled 2023-08-03: qty 30, 10d supply, fill #0

## 2023-08-03 MED ORDER — APIXABAN 5 MG PO TABS: 5.0000 mg | ORAL_TABLET | Freq: Two times a day (BID) | ORAL | Status: DC

## 2023-08-03 MED ORDER — METOPROLOL TARTRATE 25 MG PO TABS
37.5000 mg | ORAL_TABLET | Freq: Two times a day (BID) | ORAL | 0 refills | Status: AC
  Filled 2023-08-03: qty 90, 30d supply, fill #0

## 2023-08-03 MED ORDER — APIXABAN 5 MG PO TABS
5.0000 mg | ORAL_TABLET | Freq: Two times a day (BID) | ORAL | 0 refills | Status: AC
  Filled 2023-08-03: qty 60, 30d supply, fill #0

## 2023-08-03 MED ORDER — ATORVASTATIN CALCIUM 80 MG PO TABS
80.0000 mg | ORAL_TABLET | Freq: Every day | ORAL | 0 refills | Status: AC
  Filled 2023-08-03: qty 30, 30d supply, fill #0

## 2023-08-03 NOTE — Care Management CC44 (Signed)
 Condition Code 44 Documentation Completed  Patient Details  Name: Anthony [name] MRN: [id number] Date of Birth: May 10, 1956   Condition Code 44 given:  Yes Patient signature on Condition Code 44 notice:  Yes Documentation of 2 MD's agreement:  Yes Code 44 added to claim:  Yes    Kermit Balo, RN 08/03/2023, 3:58 PM

## 2023-08-03 NOTE — Hospital Course (Addendum)
#  Acute left PCA stroke #Hyperlipidemia #Hypertension Presented with dizziness and vision changes.  CT head unremarkable.  CTA of his head and neck demonstrated PCA occlusion that was too distal for intervention.  MRI confirmed acute stroke of the left PCA territory affecting the left medial up to the middle lobe.  Minor involvement of the posterior left illness also noted.  Given history of A-fib and patient not been on anticoagulation, etiology concerning for embolic.  New CHA2DS2-VASc score is 4.  Echo noted EF of 55 to 60% with no regional wall motion abnormalities. LDL elevated at 181.  Patient started on aspirin as well as DOAC. Statin dosage increased.  - Continue aspirin 81 mg daily and Eliquis 5 mg twice daily - Continue atorvastatin 80 mg daily - Refrain from driving and follow-up with ophthalmology  #Atrial fibrillation Seen by cardiology in the past.  Last visit was with Dr. Wyline Mood in 2019.  He was not started on anticoagulation at that time as his CHA2DS2-VASc score was 0.  Currently on metoprolol 25 mg but has been adherent.  Rates are stable in the 70s and 90s.  He was started on a DOAC and will need to follow-up outpatient.   - Continue Eliquis 5 mg twice daily  #Mild aortic insufficiency #Mild mitral regurg Abnormalities noted back in 2019.  Has seen cardiology in the past for this.  Echo today demonstrated mild mitral valve regurg.

## 2023-08-03 NOTE — Plan of Care (Signed)
 Discharge instructions discussed with patient.  Patient instructed on home medications, restrictions, and follow up appointments. Belongings gathered and sent with patient.  Patients medications given to them at bedside, retrieved from [facility].

## 2023-08-03 NOTE — Discharge Instructions (Signed)

## 2023-08-03 NOTE — Discharge Summary (Addendum)
 Name: Anthony [name] MRN: [id number] DOB: 02/03/1956 68 y.o. PCP: Lianne Moris, PA-C  Date of Admission: 08/02/2023  6:45 AM Date of Discharge: 08/03/2023 08/03/23 Attending Physician: Dr. 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   atorvastatin 80 MG tablet Commonly known as: LIPITOR Take 1 tablet (80 mg total) by mouth daily. What changed:  medication strength how much to take   Eliquis 5 MG Tabs tablet Generic drug: apixaban Take 1 tablet (5 mg total) by mouth 2 (two) times daily.   metoprolol tartrate 25 MG tablet Commonly known as: LOPRESSOR Take 1.5 tablets (37.5 mg total) by mouth 2 (two) times daily.        DISPOSITION AND FOLLOW-UP:  Anthony [name] was discharged from [facility] in Stable condition. At the hospital follow up visit please address:  Follow-up Recommendations: Consults: Cardiology, ophthalmology, neurology Labs:  None Studies: None Medications: Eliquis,atorvastatin, aspirin, metoprolol    Follow-up Information     [facility]. Schedule an appointment as soon as possible for a visit in 8 week(s).   Specialty: Neurology Why: Please see Dr. Pearlean Brownie or associate 8 weeks after discharge Contact information: [address] [place] [address] [phone]                HOSPITAL COURSE:  Patient  Summary: #Acute left PCA stroke #Hyperlipidemia #Hypertension Presented with dizziness and vision changes.  CT head unremarkable.  CTA of his head and neck demonstrated PCA occlusion that was too distal for intervention.  MRI confirmed acute stroke of the left PCA territory affecting the left medial up to the middle lobe.  Minor involvement of the posterior left illness also noted.  Given history of A-fib and patient not been on anticoagulation, etiology concerning for embolic.  New CHA2DS2-VASc score is 4.  Echo noted EF of 55 to 60% with no regional wall motion abnormalities. LDL elevated at 181.  Patient started on aspirin as well as DOAC. Statin dosage increased.  - Continue aspirin 81 mg daily and Eliquis 5 mg twice daily - Continue atorvastatin 80 mg daily - Refrain from driving and follow-up with ophthalmology  #Atrial fibrillation Seen by cardiology in the past.  Last visit was with Dr. Wyline Mood in 2019.  He was not started on anticoagulation at that time as his CHA2DS2-VASc score was 0.  Currently on metoprolol 25 mg but has been adherent.  Rates are stable in the 70s and 90s.  He was started on a DOAC and will need to follow-up outpatient.   - Continue Eliquis 5 mg twice daily  #Mild aortic insufficiency #Mild mitral regurg Abnormalities noted back in 2019.  Has seen cardiology in the past for this.  Echo today demonstrated mild mitral valve regurg.   DISCHARGE INSTRUCTIONS:   Discharge Instructions     Call MD for:  difficulty breathing, headache or visual  disturbances   Complete by: As directed    Call MD for:  extreme fatigue   Complete by: As directed    Call MD for:  hives   Complete by: As directed    Call MD for:  persistant dizziness or light-headedness   Complete by: As directed    Call MD for:  persistant nausea and vomiting   Complete by: As directed    Call MD for:  redness, tenderness, or signs of infection (pain, swelling, redness, odor or green/yellow discharge  around incision site)   Complete by: As directed    Call MD for:  severe uncontrolled pain   Complete by: As directed    Call MD for:  temperature >100.4   Complete by: As directed    Diet - low sodium heart healthy   Complete by: As directed    Discharge instructions   Complete by: As directed    Anthony [name], Anthony [name] were hospitalized for stroke.  We believe this may have been caused by your atrial fibrillation.  Please *START*  taking the following medication:  - Eliquis 5 mg two times per day  - Atorvastatin 80 mg daily - Aspirin 81 mg daily - Lopressor 25 mg daily   Due to your vision loss, do not drive.  Please make sure that you schedule a follow-up examination with your ophthalmologist who would be able to further evaluate you and clear you for driving.  Please also make sure to schedule follow-up appointment with your primary care provider.  We are glad that you are feeling better!   Increase activity slowly   Complete by: As directed        SUBJECTIVE:  Patient was evaluated at bedside. Endorses headache with some nausea. Denies any chest pain, palpitations, troubles breathing, or abdominal pain. Notes that he has no acute changes in vision with exception of decreased peripheral vision of right eye noted on admission. Counseled on importance of medications adherence and appropriate follow-up.   Discharge Vitals:   BP 130/69 (BP Location: Right Arm)   Pulse 83   Temp 97.9 F (36.6 C) (Oral)   Resp 17   Ht 6' (1.829 m)   Wt 112 kg   SpO2 95%   BMI 33.50 kg/m   OBJECTIVE:  Physical Exam Constitutional:      Appearance: Normal appearance.  HENT:     Head: Normocephalic and atraumatic.  Cardiovascular:     Rate and Rhythm: Normal rate. Rhythm irregular.  Neurological:     Mental Status: He is alert and oriented to person, place, and time.     Comments: Decreased peripheral vision in right eye   Psychiatric:        Mood and Affect: Mood normal.        Behavior:  Behavior normal.     Pertinent Labs, Studies, and Procedures:     Latest Ref Rng & Units 08/02/2023    7:01 AM 08/02/2023    6:59 AM 09/12/2016    5:33 PM  CBC  WBC 4.0 - 10.5 K/uL  8.1    Hemoglobin 13.0 - 17.0 g/dL 46.9  62.9  52.8   Hematocrit 39.0 - 52.0 % 43.0  44.2  40.0   Platelets 150 - 400 K/uL  239         Latest Ref Rng & Units 08/02/2023    7:01 AM 08/02/2023    6:59 AM 09/12/2016    5:33 PM  CMP  Glucose 70 -  99 mg/dL 528  413  86   BUN 8 - 23 mg/dL 10  7  12    Creatinine 0.61 - 1.24 mg/dL 2.44  0.10  2.72   Sodium 135 - 145 mmol/L 141  138  141   Potassium 3.5 - 5.1 mmol/L 4.7  4.7  4.0   Chloride 98 - 111 mmol/L 107  111  105   CO2 22 - 32 mmol/L  24    Calcium 8.9 - 10.3 mg/dL  8.9    Total Protein 6.5 - 8.1 g/dL  6.7    Total Bilirubin 0.0 - 1.2 mg/dL  0.7    Alkaline Phos 38 - 126 U/L  42    AST 15 - 41 U/L  21    ALT 0 - 44 U/L  23      ECHOCARDIOGRAM COMPLETE Result Date: 08/02/2023    ECHOCARDIOGRAM REPORT   Patient Name:   Anthony [name] Date of Exam: 08/02/2023 Medical Rec #:  [id number]     Height:       72.0 in Accession #:    [id number]    Weight:       247.0 lb Date of Birth:  [birth year]     BSA:          2.331 m Patient Age:    67 years      BP:           141/96 mmHg Patient Gender: M             HR:           97 bpm. Exam Location:  Inpatient Procedure: 2D Echo, Color Doppler and Cardiac Doppler (Both Spectral and Color            Flow Doppler were utilized during procedure). Indications:    Stroke i63.9  History:        Patient has prior history of Echocardiogram examinations, most                 recent 11/08/2017. Arrythmias:Atrial Fibrillation; Risk                 Factors:Dyslipidemia.  Sonographer:    Irving Burton Senior RDCS Referring Phys: [id number] Lynnae January IMPRESSIONS  1. Strain and 3D EF not performed. Left ventricular ejection fraction, by estimation, is 55 to 60%. The left ventricle has normal function. The left ventricle has no regional wall motion  abnormalities. Left ventricular diastolic function could not be evaluated.  2. Right ventricular systolic function is normal. The right ventricular size is normal. There is normal pulmonary artery systolic pressure. The estimated right ventricular systolic pressure is 25.5 mmHg.  3. Left atrial size was moderately dilated.  4. The mitral valve is abnormal. Mild mitral valve regurgitation.  5. The aortic valve is tricuspid. There is mild calcification of the aortic valve. Aortic valve regurgitation is mild. Aortic valve sclerosis is present, with no evidence of aortic valve stenosis.  6. Aortic dilatation noted. There is mild dilatation of the ascending aorta, measuring 40 mm.  7. The inferior vena cava is normal in size with greater than 50% respiratory variability, suggesting right atrial pressure of 3 mmHg. FINDINGS  Left Ventricle: Strain and 3D EF not performed. Left ventricular ejection fraction, by estimation, is 55 to 60%. The left ventricle has normal function. The left ventricle has no regional wall motion abnormalities. Strain was performed and the global longitudinal strain is indeterminate. The left ventricular internal cavity size was  normal in size. There is no left ventricular hypertrophy. Left ventricular diastolic function could not be evaluated due to atrial fibrillation. Left ventricular diastolic function could not be evaluated. Right Ventricle: The right ventricular size is normal. No increase in right ventricular wall thickness. Right ventricular systolic function is normal. There is normal pulmonary artery systolic pressure. The tricuspid regurgitant velocity is 2.37 m/s, and  with an assumed right atrial pressure of 3 mmHg, the estimated right ventricular systolic pressure is 25.5 mmHg. Left Atrium: Left atrial size was moderately dilated. Right Atrium: Right atrial size was normal in size. Pericardium: There is no evidence of pericardial effusion. Presence of epicardial fat layer. Mitral  Valve: The mitral valve is abnormal. There is mild thickening of the mitral valve leaflet(s). There is mild calcification of the mitral valve leaflet(s). Mild mitral annular calcification. Mild mitral valve regurgitation. Tricuspid Valve: The tricuspid valve is normal in structure. Tricuspid valve regurgitation is mild. Aortic Valve: The aortic valve is tricuspid. There is mild calcification of the aortic valve. Aortic valve regurgitation is mild. Aortic valve sclerosis is present, with no evidence of aortic valve stenosis. Pulmonic Valve: The pulmonic valve was normal in structure. Pulmonic valve regurgitation is trivial. Aorta: Aortic dilatation noted. Ascending aorta measurements are within normal limits for age when indexed to body surface area. There is mild dilatation of the ascending aorta, measuring 40 mm. Venous: The inferior vena cava is normal in size with greater than 50% respiratory variability, suggesting right atrial pressure of 3 mmHg. IAS/Shunts: No atrial level shunt detected by color flow Doppler. Additional Comments: 3D was performed not requiring image post processing on an independent workstation and was indeterminate.  LEFT VENTRICLE PLAX 2D LVIDd:         3.80 cm LVIDs:         2.80 cm LV PW:         1.00 cm LV IVS:        0.90 cm LVOT diam:     1.90 cm LV SV:         52 LV SV Index:   22 LVOT Area:     2.84 cm  RIGHT VENTRICLE RV S prime:     9.48 cm/s TAPSE (M-mode): 1.7 cm LEFT ATRIUM           Index        RIGHT ATRIUM           Index LA diam:      3.60 cm 1.54 cm/m   RA Area:     19.70 cm LA Vol (A2C): 67.8 ml 29.09 ml/m  RA Volume:   52.80 ml  22.66 ml/m LA Vol (A4C): 94.4 ml 40.48 ml/m  AORTIC VALVE LVOT Vmax:   98.60 cm/s LVOT Vmean:  73.167 cm/s LVOT VTI:    0.183 m  AORTA Ao Root diam: 3.50 cm Ao Asc diam:  4.00 cm TRICUSPID VALVE TR Peak grad:   22.5 mmHg TR Vmax:        237.00 cm/s  SHUNTS Systemic VTI:  0.18 m Systemic Diam: 1.90 cm Charlton Haws MD Electronically signed by  Charlton Haws MD Signature Date/Time: 08/02/2023/12:22:52 PM    Final    MR BRAIN WO CONTRAST Result Date: 08/02/2023 CLINICAL DATA:  Stroke. Follow-up. Headache and right visual field loss EXAM: MRI HEAD WITHOUT CONTRAST TECHNIQUE: Multiplanar, multiecho pulse sequences of the brain and surrounding structures were obtained without intravenous contrast. COMPARISON:  CT studies same day FINDINGS: Brain: Diffusion imaging shows  acute infarction in the left PCA territory affecting the medial occipital lobe. Minor involvement of the posterior left thalamus. No evidence of hemorrhage or mass effect. Otherwise, the brainstem and cerebellum are normal. Cerebral hemispheres are otherwise normal. No evidence of widespread small-vessel disease. No mass, hemorrhage, hydrocephalus or extra-axial collection. Vascular: Major vessels at the base of the brain show flow. Skull and upper cervical spine: Negative Sinuses/Orbits: Clear/normal Other: None IMPRESSION: Acute infarction in the left PCA territory affecting the medial left occipital lobe. Minor involvement of the posterior left thalamus. No evidence of hemorrhage or mass effect. Electronically Signed   By: Paulina Fusi M.D.   On: 08/02/2023 11:41   CT ANGIO HEAD NECK W WO CM W PERF (CODE STROKE) Result Date: 08/02/2023 CLINICAL DATA:  68 year old male code stroke presentation. EXAM: CT ANGIOGRAPHY HEAD AND NECK CT PERFUSION BRAIN TECHNIQUE: Multidetector CT imaging of the head and neck was performed using the standard protocol during bolus administration of intravenous contrast. Multiplanar CT image reconstructions and MIPs were obtained to evaluate the vascular anatomy. Carotid stenosis measurements (when applicable) are obtained utilizing NASCET criteria, using the distal internal carotid diameter as the denominator. Multiphase CT imaging of the brain was performed following IV bolus contrast injection. Subsequent parametric perfusion maps were calculated using RAPID  software. RADIATION DOSE REDUCTION: This exam was performed according to the departmental dose-optimization program which includes automated exposure control, adjustment of the mA and/or kV according to patient size and/or use of iterative reconstruction technique. CONTRAST:  OMNIPAQUE IOHEXOL 350 MG/ML SOLN COMPARISON:  Plain head CT 0708 hours today. FINDINGS: CT Brain Perfusion Findings: ASPECTS: 10 CBF (<30%) Volume: 0mL.  No CBV parameter abnormality. Perfusion (Tmax>6.0s) volume: 20mL.  Hypoperfusion index of zero. Mismatch Volume: 20mL Infarction Location:Left PCA territory. CTA NECK Skeleton: Absent maxillary dentition. Cervical spine degeneration. No acute osseous abnormality identified. Upper chest: Negative upper lungs and superior mediastinum. Other neck: Neck soft tissue spaces are within normal limits. Aortic arch: Aberrant origin right subclavian artery. Calcified aortic atherosclerosis. 4 vessel arch configuration. Right carotid system: Mildly tortuous right CCA. Patent right carotid bifurcation. Tortuous right ICA distal to the bulb. Minimal atherosclerosis and no stenosis. Left carotid system: Mildly tortuous left CCA with no plaque or stenosis. Patent left carotid bifurcation. Soft plaque at the left ICA distal ball suspected (series 9, image 28) but no significant stenosis to the skull base. Vertebral arteries: Aberrant origin right subclavian artery with mild plaque and no stenosis. Normal right vertebral artery origin. Right vertebral artery patent and within normal limits to the skull base. Proximal left subclavian soft and calcified plaque without stenosis. Left vertebral artery origin remains normal. Tortuous left V1 segment. Fairly codominant left vertebral artery is patent to the skull base with no significant plaque or stenosis. CTA HEAD Posterior circulation: Distal vertebral arteries and vertebrobasilar junction are patent. Both PICA origins are patent. There is bilateral distal  V4 segment, vertebrobasilar junction mild plaque but no significant stenosis (series 8, image 30). Patent mildly tortuous basilar artery without stenosis. Patent SCA and PCA origins. Posterior communicating arteries are diminutive or absent. Right PCA branches are within normal limits. Left PCA P3 superior division is occluded on series 9, image 24. No left P1 or P2 segment irregularity. Anterior circulation: Both ICA siphons are patent. Minimal right siphon calcified plaque and no stenosis. Mild but more pronounced left siphon, mostly supraclinoid segment calcified plaque with no significant stenosis. Patent carotid termini, MCA and ACA origins. Normal anterior communicating artery, bilateral  ACA branches. Left MCA M1 segment and bifurcation are patent without stenosis. Right MCA M1 segment and bifurcation are patent without stenosis. Bilateral MCA branches are within normal limits. Venous sinuses: Early contrast timing, grossly patent. Anatomic variants: None significant. Review of the MIP images confirms the above findings IMPRESSION: 1. Positive for Left PCA P3 Occlusion (superior division) with concordant CTP detecting 20 mL Oligemia in the Left PCA territory. No infarct core detected by the standard parameter. These results were communicated to Dr. Amada Jupiter at 0730 hours on 08/02/2023 by text page via the [facility] messaging system. 2. No other large vessel occlusion. No other significant posterior circulation stenosis. Mild for age atherosclerosis by CTA in the neck and at the skull base. 3. Aberrant origin right subclavian artery. Aortic Atherosclerosis (ICD10-I70.0). Electronically Signed   By: Odessa Fleming M.D.   On: 08/02/2023 07:38   CT HEAD CODE STROKE WO CONTRAST Result Date: 08/02/2023 CLINICAL DATA:  Code stroke. 68 year old male right side vision loss and vomiting. EXAM: CT HEAD WITHOUT CONTRAST TECHNIQUE: Contiguous axial images were obtained from the base of the skull through the vertex without  intravenous contrast. RADIATION DOSE REDUCTION: This exam was performed according to the departmental dose-optimization program which includes automated exposure control, adjustment of the mA and/or kV according to patient size and/or use of iterative reconstruction technique. COMPARISON:  None Available. FINDINGS: Brain: Cerebral volume is within normal limits for age. No midline shift, mass effect, or evidence of intracranial mass lesion. No ventriculomegaly. Gray-white differentiation maintained bilaterally, within normal limits for age throughout the brain. No encephalomalacia identified. Vascular: Calcified atherosclerosis at the skull base. No suspicious intracranial vascular hyperdensity. Skull: No acute osseous abnormality identified. Sinuses/Orbits: Visualized paranasal sinuses and mastoids are absent maxillary dentition. Well aerated. Other: Orbit and scalp soft tissues are within normal limits. ASPECTS [facility] Stroke Program Early CT Score) Total score (0-10 with 10 being normal): 10 IMPRESSION: Calcified atherosclerosis at the skull base but normal for age noncontrast CT appearance of the brain. ASPECTS 10. These results were communicated to Dr. Amada Jupiter at 7:16 am on 08/02/2023 by text page via the [facility] messaging system. Electronically Signed   By: Odessa Fleming M.D.   On: 08/02/2023 07:16     Signed: Morrie Sheldon, MD Internal Medicine Resident, PGY-1 [facility]  Pager: [phone]

## 2023-08-03 NOTE — Progress Notes (Signed)
 PHARMACY - ANTICOAGULATION CONSULT NOTE  Pharmacy Consult for apixaban Indication: atrial fibrillation  No Known Allergies  Patient Measurements: Height: 6' (182.9 cm) Weight: 112 kg (247 lb) IBW/kg (Calculated) : 77.6  Vital Signs: Temp: 97.9 F (36.6 C) (03/06 1151) Temp Source: Oral (03/06 1151) BP: 130/69 (03/06 1151) Pulse Rate: 83 (03/06 1151)  Labs: Recent Labs    08/02/23 0659 08/02/23 0701  HGB 15.2 14.6  HCT 44.2 43.0  PLT 239  --   APTT 26  --   LABPROT 14.9  --   INR 1.2  --   CREATININE 1.03 1.20    Estimated Creatinine Clearance: 77.2 mL/min (by C-G formula based on SCr of 1.2 mg/dL).   Medical History: Past Medical History:  Diagnosis Date   GERD (gastroesophageal reflux disease)    Hyperlipidemia     Medications:  Medications Prior to Admission  Medication Sig Dispense Refill Last Dose/Taking   aspirin EC 81 MG tablet Take 81 mg by mouth daily. (Patient not taking: Reported on 08/02/2023)   Not Taking   atorvastatin (LIPITOR) 40 MG tablet TAKE 1 TABLET DAILY. (Patient not taking: Reported on 08/02/2023) 30 tablet 6 Not Taking   metoprolol tartrate (LOPRESSOR) 25 MG tablet Take 1.5 tablets (37.5 mg total) by mouth 2 (two) times daily. (Patient not taking: Reported on 08/02/2023) 270 tablet 1 Not Taking    Assessment: Patient is a 68 YO male with PMH atrial fibrillation, HLD, GERD admitted on 3/5 for loss of right visual field and headache. Confirmed to have PCA occlusion per CT angiography that was outside window of thrombolytics. Patient was not previously on anticoagulation for Afib (CHA2DS2-VASc score 0). CHA2DS2-VASc score now 4 with this new stroke.   Goal of Therapy:  Monitor platelets by anticoagulation protocol: Yes   Plan:  Start apixaban (Eliquis) 5 mg PO BID, first dose 3/6 @ 20:00 DOAC education before D/C  Pharmacy to follow peripherally  Lendon Ka, PharmD Candidate  08/03/2023,2:06 PM

## 2023-08-03 NOTE — Evaluation (Signed)
 Physical Therapy Brief Evaluation and Discharge Note Patient Details Name: Anthony [name] MRN: [id number] DOB: July 18, 1955 Today's Date: 08/03/2023   History of Present Illness  68 year old male adm 3/5 presenting with headache and loss of right visual field.  Patient also complained of lower extermity weakness.  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 Minor involvement of the posterior left thalamus.  Clinical Impression  Pt presents with admitting diagnosis above. Pt today was able to ambulate in hallway and navigate stairs independently despite visual deficits. 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 Pt presents at or near baseline mobility. Pt has no further acute PT needs and will be signing off. Re consult PT if mobility status changes. Recommend pt follow up with neuro ophthalmologist.        PT Assessment Patient does not need any further PT services  Assistance Needed at Discharge  PRN    Equipment Recommendations None recommended by PT  Recommendations for Other Services       Precautions/Restrictions Precautions Precautions: Fall Recall of Precautions/Restrictions: Intact Restrictions Weight Bearing Restrictions Per Provider Order: No        Mobility  Bed Mobility   Supine/Sidelying to sit: Independent Sit to supine/sidelying: Independent    Transfers Overall transfer level: Independent Equipment used: None               General transfer comment: Normal pace, able to scan well    Ambulation/Gait Ambulation/Gait assistance: Independent Gait Distance (Feet): 200 Feet Assistive device: None Gait Pattern/deviations: WFL(Within Functional Limits) Gait Speed: Pace WFL General Gait Details: no LOB noted. Despite vision deficits pt able to navigate obstacles well.  Home Activity Instructions    Stairs Stairs: Yes Stairs assistance: Independent Stair Management: Two rails,  Alternating pattern, Forwards Number of Stairs: 2 General stair comments: no LOB noted  Modified Rankin (Stroke Patients Only)        Balance Overall balance assessment: Independent                        Pertinent Vitals/Pain PT - Brief Vital Signs All Vital Signs Stable: Yes Pain Assessment Pain Assessment: No/denies pain     Home Living Family/patient expects to be discharged to:: Private residence Living Arrangements: Spouse/significant other Available Help at Discharge: Family;Available 24 hours/day Home Environment: Stairs to enter  [employer] of Steps: 7 Home Equipment: None        Prior Function Level of Independence: Independent Comments: 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    UE/LE Assessment   UE ROM/Strength/Tone/Coordination: WFL    LE ROM/Strength/Tone/Coordination: [facility]      Communication   Communication Communication: No apparent difficulties     Cognition Overall Cognitive Status: Appears within functional limits for tasks assessed/performed       General Comments General comments (skin integrity, edema, etc.): VSS    Exercises     Assessment/Plan    PT Problem List         PT Visit Diagnosis Other abnormalities of gait and mobility (R26.89)    No Skilled PT Patient at baseline level of functioning;Patient is independent with all acitivity/mobility   Co-evaluation                AMPAC 6 Clicks Help needed turning from your back to your side while in a flat bed without using bedrails?: None Help needed moving from lying on your back  to sitting on the side of a flat bed without using bedrails?: None Help needed moving to and from a bed to a chair (including a wheelchair)?: None Help needed standing up from a chair using your arms (e.g., wheelchair or bedside chair)?: None Help needed to walk in hospital room?: None Help needed climbing 3-5 steps with a railing? : None 6 Click Score: 24       End of Session Equipment Utilized During Treatment: Gait belt Activity Tolerance: Patient tolerated treatment well Patient left: in bed;with call bell/phone within reach;with family/visitor present Nurse Communication: Mobility status PT Visit Diagnosis: Other abnormalities of gait and mobility (R26.89)     Time: 0102-7253 PT Time Calculation (min) (ACUTE ONLY): 12 min  Charges:   PT Evaluation $PT Eval Low Complexity: 1 Low      Vivi Piccirilli B, PT, DPT Acute Rehab Services [phone]   Gladys Damme  08/03/2023, 11:30 AM

## 2023-08-03 NOTE — Plan of Care (Signed)

## 2023-08-03 NOTE — Care Management Obs Status (Signed)
 MEDICARE OBSERVATION STATUS NOTIFICATION   Patient Details  Name: Anthony [name] MRN: [id number] Date of Birth: 1956/03/15   Medicare Observation Status Notification Given:  Yes    Kermit Balo, RN 08/03/2023, 3:58 PM

## 2023-08-03 NOTE — TOC Transition Note (Signed)
 Transition of Care [facility]) - Discharge Note   Patient Details  Name: Anthony [name] MRN: [id number] Date of Birth: December 15, 1955  Transition of [facility]) CM/SW Contact:  Kermit Balo, RN Phone Number: 08/03/2023, 2:58 PM   Clinical Narrative:    Pt is discharging home with self care. No follow up per therapies.  Pt discharging on Eliquis. TOC pharmacy will provide 30 day free coupon. Pt has transportation home.   Final next level of care: Home/Self Care Barriers to Discharge: No Barriers Identified   Patient Goals and CMS Choice            Discharge Placement                       Discharge Plan and Services Additional resources added to the After Visit Summary for                                       Social Drivers of Health (SDOH) Interventions SDOH Screenings   Food Insecurity: No Food Insecurity (08/02/2023)  Housing: Low Risk  (08/02/2023)  Transportation Needs: No Transportation Needs (08/02/2023)  Utilities: Not At Risk (08/02/2023)  Social Connections: Moderately Integrated (08/02/2023)  Tobacco Use: Low Risk  (08/02/2023)     Readmission Risk Interventions     No data to display

## 2023-08-03 NOTE — Progress Notes (Addendum)
 STROKE TEAM PROGRESS NOTE   INTERIM HISTORY/SUBJECTIVE  Patient remains hemodynamically stable and afebrile.  He continues to report right visual field cut.  He does part-time work at a [employer], and he was instructed not to drive unless his vision returns to normal and he undergoes outpatient occupational therapy driving evaluation.  OBJECTIVE  CBC    Component Value Date/Time   WBC 8.1 08/02/2023 0659   RBC 5.16 08/02/2023 0659   HGB 14.6 08/02/2023 0701   HCT 43.0 08/02/2023 0701   PLT 239 08/02/2023 0659   MCV 85.7 08/02/2023 0659   MCH 29.5 08/02/2023 0659   MCHC 34.4 08/02/2023 0659   RDW 13.3 08/02/2023 0659   LYMPHSABS 1.4 08/02/2023 0659   MONOABS 0.6 08/02/2023 0659   EOSABS 0.2 08/02/2023 0659   BASOSABS 0.1 08/02/2023 0659    BMET    Component Value Date/Time   NA 141 08/02/2023 0701   K 4.7 08/02/2023 0701   CL 107 08/02/2023 0701   CO2 24 08/02/2023 0659   GLUCOSE 122 (H) 08/02/2023 0701   BUN 10 08/02/2023 0701   CREATININE 1.20 08/02/2023 0701   CALCIUM 8.9 08/02/2023 0659   GFRNONAA >60 08/02/2023 0659    IMAGING past 24 hours No results found.  Vitals:   08/02/23 2336 08/03/23 0335 08/03/23 0754 08/03/23 1151  BP: 120/75 116/67 (!) 160/97 130/69  Pulse: 94 92 91 83  Resp: 18 18 18 17   Temp: 98.6 F (37 C) 98.5 F (36.9 C) 98.1 F (36.7 C) 97.9 F (36.6 C)  TempSrc: Oral Oral Oral Oral  SpO2: 93% 97% 96% 95%  Weight:      Height:         PHYSICAL EXAM General:  Alert, well-nourished, well-developed patient in no acute distress Psych:  Mood and affect appropriate for situation CV: Regular rate and rhythm on monitor Respiratory:  Regular, unlabored respirations on room air GI: Abdomen soft and nontender   NEURO:  Mental Status: AA&Ox3, patient is able to give clear and coherent history Speech/Language: speech is without dysarthria or aphasia.    Cranial Nerves:  II: PERRL.  Right hemianopsia III, IV, VI: EOMI. Eyelids  elevate symmetrically.  V: Sensation is intact to light touch and symmetrical to face.  VII: Face is symmetrical resting and smiling VIII: hearing intact to voice. IX, X: Palate elevates symmetrically. Phonation is normal.  YN:WGNFAOZH shrug 5/5. XII: tongue is midline without fasciculations. Motor: 5/5 strength to all muscle groups tested.  Tone: is normal and bulk is normal Sensation- Intact to light touch bilaterally.  Coordination: FTN intact bilaterally Gait- deferred  Most Recent NIH  1a Level of Conscious.: 0 1b LOC Questions: 0 1c LOC Commands: 0 2 Best Gaze: 0 3 Visual: 1 4 Facial Palsy: 0 5a Motor Arm - left: 0 5b Motor Arm - Right: 0 6a Motor Leg - Left: 0 6b Motor Leg - Right: 0 7 Limb Ataxia: 0 8 Sensory: 0 9 Best Language: 0 10 Dysarthria: 0 11 Extinct. and Inatten.: 0 TOTAL: 1   ASSESSMENT/Anthony  Anthony Anthony is a 67 y.o. male with history of A-fib not on anticoagulation, hyperlipidemia and GERD admitted for right hemianopsia, headache and nausea.  He was found to have a left PCA P3 occlusion, which was too distal for mechanical thrombectomy.  He was found to have a stroke in the left PCA territory on MRI.  NIH on Admission 1 Patient does work part-time as a [employer] and was  instructed not to drive unless his vision returns to normal.  Stroke:  left PCA territory stroke, etiology: Likely cardioembolic in the setting of atrial fibrillation not on anticoagulation Code Stroke CT head No acute abnormality. ASPECTS 10.    CTA head & neck left PCA P3 occlusion CT perfusion no infarct core in 20 mL oligemia in left PCA territory MRI left PCA territory acute infarct with minor involvement of posterior left thalamus 2D Echo EF 55 to 60%, moderately dilated left atrium, no atrial level shunt LDL 181 HgbA1c 5.3 VTE prophylaxis -beginning anticoagulation with Eliquis tonight No antithrombotics prior to admission, now on Eliquis (apixaban) bid to start  tonight Therapy recommendations:  No follow up needed  Disposition: home, need to follow up with ophthalmologist. No driving until cleared by ophthalmologist. Pt and family are aware.   Atrial fibrillation Home Meds: none Rate controlled Begin anticoagulation with Eliquis, first dose tonight  Hypertension Home meds: None Stable LT BP goal normotensive  Hyperlipidemia Home meds: none LDL 181, goal < 70 Put on lipitor 80 Continue statin at discharge  Other Stroke Risk Factors Obesity, Body mass index is 33.5 kg/m., BMI >/= 30 associated with increased stroke risk, recommend weight loss, diet and exercise as appropriate   Other Active Problems None  Hospital day # 1  Patient seen by NP and then by MD, MD to edit note as needed. Anthony Anthony , MSN, AGACNP-BC [facility] See Amion for schedule and pager information 08/03/2023 1:27 PM   ATTENDING NOTE: I reviewed above note and agree with the assessment and Anthony. Pt was seen and examined.   Wife at the bedside. Pt stated that he still has right visual field deficit. He stated that he was not taking any medication at home but now he learned his lesson. He will compliant with eliquis and lipitor. Educated on no driving and follow up with ophthalmology and only drive if cleared by ophthal in the future. He and his wife are aware.   For detailed assessment and Anthony, please refer to above/below as I have made changes wherever appropriate.   Neurology will sign off. Please call with questions. Pt will follow up with stroke clinic NP at [facility] in about 4 weeks. Thanks for the consult.   Anthony Plan, MD PhD Stroke Neurology 08/03/2023 3:35 PM    To contact Stroke Continuity provider, please refer to [email or web address]. After hours, contact General Neurology

## 2023-08-05 ENCOUNTER — Encounter (HOSPITAL_COMMUNITY): Payer: Self-pay | Admitting: Internal Medicine

## 2023-08-05 DIAGNOSIS — H53461 Homonymous bilateral field defects, right side: Secondary | ICD-10-CM

## 2023-09-02 DIAGNOSIS — R519 Headache, unspecified: Secondary | ICD-10-CM | POA: Diagnosis not present

## 2023-09-02 DIAGNOSIS — I4891 Unspecified atrial fibrillation: Secondary | ICD-10-CM | POA: Diagnosis not present

## 2023-09-02 DIAGNOSIS — R110 Nausea: Secondary | ICD-10-CM | POA: Diagnosis not present

## 2023-09-02 DIAGNOSIS — Z8673 Personal history of transient ischemic attack (TIA), and cerebral infarction without residual deficits: Secondary | ICD-10-CM | POA: Diagnosis not present

## 2023-09-08 NOTE — Progress Notes (Signed)
 [facility] [address] [place]. [place] [address] [phone]       HOSPITAL FOLLOW UP NOTE  Mr. ELSIE SAKUMA Date of Birth:  [birth year] Medical Record Number:  [id number]   Reason for Referral:  hospital stroke follow up    SUBJECTIVE:   CHIEF COMPLAINT:  Chief Complaint  Patient presents with   Cerebrovascular Accident    Rm 3 with spouse Pt is well, reports he is still having R visual impairment. No other concerns.     HPI:   Mr. SYRUS NAKAMA is a 68 y.o. male with history of A-fib not on anticoagulation, hyperlipidemia and GERD who presented to ED on 08/02/2023 with right hemianopsia, headache and nausea.  Stroke workup revealed left PCA territory infarct with left PCA P3 occlusion likely cardioembolic in setting of A-fib not on AC.  EF 55 to 60%.  LDL 181.  A1c 5.3.  Evaluated by neurology and cardiology, initiated Eliquis 5 mg twice daily and atorvastatin 80 mg daily for further stroke prevention.  Residual right visual field deficit and was advised no driving until cleared by ophthalmology.  He was discharged home without therapy needs.   Today, 09/11/2023, patient is being seen for initial hospital follow-up accompanied by his wife.  Continues to have right peripheral visual loss, has not noticed any significant changes since discharge.  He has returned back to driving and denies any difficulty with this.  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  He is scheduled to see Dr. Candi Chafe on 4/16.  He does report occasional frontal headaches associated with dizziness usually present in the later afternoon, he also has tinnitus which is chronic but feels it slightly worsening poststroke. This has been gradually improving since discharge where headaches and dizziness are not as frequent nor severe.  He is outside majority of the day doing various things. He did use Dramamine once which he felt helped with his  dizziness.  No new stroke/TIA symptoms.  Reports compliance on Eliquis and atorvastatin without side effects.  He is also currently on aspirin 81 mg daily, per hospital discharge summary per hospitalist, he was also started on aspirin 81 mg daily for secondary stroke prevention.  Routinely follows with PCP for stroke risk factor management.  Has not yet had follow-up with cardiology, previously followed by Dr. Amanda Jungling with last visit in 2019.     PERTINENT IMAGING  Code Stroke CT head No acute abnormality. ASPECTS 10.    CTA head & neck left PCA P3 occlusion CT perfusion no infarct core in 20 mL oligemia in left PCA territory MRI left PCA territory acute infarct with minor involvement of posterior left thalamus 2D Echo EF 55 to 60%, moderately dilated left atrium, no atrial level shunt LDL 181 HgbA1c 5.3    ROS:   14 system review of systems performed and negative with exception of those listed in HPI  PMH:  Past Medical History:  Diagnosis Date   Acute left PCA stroke (HCC) 08/02/2023   GERD (gastroesophageal reflux disease)    Hyperlipidemia     PSH:  Past Surgical History:  Procedure Laterality Date   BACK SURGERY     disc decompression    Social History:  Social History   Socioeconomic History   Marital status: Married    Spouse name: Not on file   Number of children: Not on file   Years of education: Not  on file   Highest education level: Not on file  Occupational History   Not on file  Tobacco Use   Smoking status: Never   Smokeless tobacco: Never  Vaping Use   Vaping status: Never Used  Substance and Sexual Activity   Alcohol use: No   Drug use: No   Sexual activity: Yes    Birth control/protection: None  Other Topics Concern   Not on file  Social History Narrative   Not on file   Social Drivers of Health   Financial Resource Strain: Not on file  Food Insecurity: No Food Insecurity (08/02/2023)   Hunger Vital Sign    Worried About Running Out of  Food in the Last Year: Never true    Ran Out of Food in the Last Year: Never true  Transportation Needs: No Transportation Needs (08/02/2023)   PRAPARE - Administrator, Civil Service (Medical): No    Lack of Transportation (Non-Medical): No  Physical Activity: Not on file  Stress: Not on file  Social Connections: Moderately Integrated (08/02/2023)   Social Connection and Isolation Panel [NHANES]    Frequency of Communication with Friends and Family: More than three times a week    Frequency of Social Gatherings with Friends and Family: Once a week    Attends Religious Services: More than 4 times per year    Active Member of [employer] or Organizations: No    Attends Banker Meetings: Never    Marital Status: Married  Catering manager Violence: Not At Risk (08/02/2023)   Humiliation, Afraid, Rape, and Kick questionnaire    Fear of Current or Ex-Partner: No    Emotionally Abused: No    Physically Abused: No    Sexually Abused: No    Family History:  Family History  Problem Relation Age of Onset   Lung cancer Father    Heart attack Maternal Grandfather     Medications:   Current Outpatient Medications on File Prior to Visit  Medication Sig Dispense Refill   apixaban (ELIQUIS) 5 MG TABS tablet Take 1 tablet (5 mg total) by mouth 2 (two) times daily. 60 tablet 0   aspirin EC 81 MG tablet Take 81 mg by mouth daily.     atorvastatin (LIPITOR) 80 MG tablet Take 1 tablet (80 mg total) by mouth daily. 30 tablet 0   metoprolol tartrate (LOPRESSOR) 25 MG tablet Take 1.5 tablets (37.5 mg total) by mouth 2 (two) times daily. 90 tablet 0   No current facility-administered medications on file prior to visit.    Allergies:  No Known Allergies    OBJECTIVE:  Physical Exam  Vitals:   09/11/23 1006  BP: (!) 139/90  Pulse: 89  Weight: 242 lb (109.8 kg)  Height: 6' (1.829 m)   Body mass index is 32.82 kg/m. No results found.  General: well developed, well  nourished, pleasant middle-age Caucasian male, seated, in no evident distress Head: head normocephalic and atraumatic.   Neck: supple with no carotid or supraclavicular bruits Cardiovascular: regular rate and rhythm, no murmurs Musculoskeletal: no deformity Skin:  no rash/petichiae Vascular:  Normal pulses all extremities   Neurologic Exam Mental Status: Awake and fully alert.  Fluent speech and language.  Oriented to place and time. Recent and remote memory intact. Attention span, concentration and fund of knowledge appropriate. Mood and affect appropriate.  Cranial Nerves: Pupils equal, briskly reactive to light. Extraocular movements full without nystagmus. Visual fields full to confrontation testing (suspect residual  very mild right hemianopia). Hearing intact. Facial sensation intact. Face, tongue, palate moves normally and symmetrically.  Motor: Normal bulk and tone. Normal strength in all tested extremity muscles Sensory.: intact to touch , pinprick , position and vibratory sensation.  Coordination: Rapid alternating movements normal in all extremities. Finger-to-nose and heel-to-shin performed accurately bilaterally. Gait and Station: Arises from chair without difficulty. Stance is normal. Gait demonstrates normal stride length and balance without use of AD. Reflexes: 1+ and symmetric. Toes downgoing.     NIHSS  0 Modified Rankin  1      ASSESSMENT: ALARIK RADU is a 68 y.o. year old male with left PCA territory infarct in 07/2023 in setting of left PCA P3 occlusion secondary to A-fib not on [facility]. Vascular risk factors include A-fib, HLD, and obesity.      PLAN:  Left PCA stroke:  Residual deficit: suspect very mild right hemianopia, unable to appreciate vision loss on confrontation testing, does have f/u with Dr. Candi Chafe on 4/16, advised peripheral vision test will help determine degree of hemianopia. Advised ability to drive will be determine by Dr. Candi Chafe. 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  Advised to discontinue aspirin as no indication for continuation from a stroke standpoint, denies hx of cardiac stents.  Discussed secondary stroke prevention measures and importance of close PCP follow up for aggressive stroke risk factor management including BP goal<130/90, HLD with LDL goal<70 and DM with A1c.<7 .  Stroke labs 07/2023: LDL 181, A1c 5.3 I have gone over the pathophysiology of stroke, warning signs and symptoms, risk factors and their management in some detail with instructions to go to the closest emergency room for symptoms of concern.  Frontal headaches:  Present since stroke. Associated with dizziness. Unclear if post stroke vs allergies with nasal congestion with significant pollen since the time of his stroke. Discussed trial of allergy medications to possibly help with symptoms. If post stroke, headaches should gradually continue to improve without intervention   Advised to call if headaches persist or worsen.  Paroxysmal atrial fibrillation: now on Eliquis 5 mg twice daily, CHA2DS2-VASc score 4 (previously 0 hence the reason DOAC not previously initiated).  Recommended reestablishing care with cardiologist Dr. Amanda Jungling as prior OV in 2019, advised to call or f/u with PCP if referral needed    No further recommendations from stroke standpoint and can follow-up on an as-needed basis.   CC:  [facility] provider: Dr. Janett Medin PCP: Fredick Jarred, PA-C    I spent 50 minutes of face-to-face and non-face-to-face time with patient and wife.  This included previsit chart review including extensive review of hospitalization, lab review, study review, order entry, electronic health record documentation, patient education and discussion regarding above diagnoses and treatment  plan and answered all other questions to patient and wife's satisfaction  Johny Nap, AGNP-BC  [facility] [address] [place], [place] [address]  Phone [phone] Fax [phone] Note: This document was prepared with digital dictation and possible smart phrase technology. Any transcriptional errors that result from this process are unintentional.

## 2023-09-11 ENCOUNTER — Encounter: Payer: Self-pay | Admitting: Adult Health

## 2023-09-11 ENCOUNTER — Ambulatory Visit: Admitting: Adult Health

## 2023-09-11 VITALS — BP 139/90 | HR 89 | Ht 72.0 in | Wt 242.0 lb

## 2023-09-11 DIAGNOSIS — I69398 Other sequelae of cerebral infarction: Secondary | ICD-10-CM

## 2023-09-11 DIAGNOSIS — H53461 Homonymous bilateral field defects, right side: Secondary | ICD-10-CM | POA: Diagnosis not present

## 2023-09-11 DIAGNOSIS — I480 Paroxysmal atrial fibrillation: Secondary | ICD-10-CM | POA: Diagnosis not present

## 2023-09-11 DIAGNOSIS — I63532 Cerebral infarction due to unspecified occlusion or stenosis of left posterior cerebral artery: Secondary | ICD-10-CM

## 2023-09-11 DIAGNOSIS — R519 Headache, unspecified: Secondary | ICD-10-CM

## 2023-09-11 NOTE — Patient Instructions (Signed)
 Unclear if your frontal headaches and dizziness are post stroke vs allergies with sinus congestion. You can try over the counter allergy medications to see if this helps. If these should persist or worsen, please let me know  Follow-up with ophthalmology Dr. Dione Booze as scheduled on Wednesday who will further advise regarding safety of returning to driving   Continue  Eliquis 5 mg twice daily   and atorvastatin 80 mg daily for secondary stroke prevention Can discontinue aspirin as no indication for continuation for stroke prevention  Would recommend follow-up with cardiology for atrial fibrillation now on Eliquis for ongoing monitoring and management - if a new referral is needed, please let me know   Continue to follow up with PCP regarding blood pressure and cholesterol management  Maintain strict control of hypertension with blood pressure goal below 130/90 and cholesterol with LDL cholesterol (bad cholesterol) goal below 70 mg/dL.   Signs of a Stroke? Follow the BEFAST method:  Balance Watch for a sudden loss of balance, trouble with coordination or vertigo Eyes Is there a sudden loss of vision in one or both eyes? Or double vision?  Face: Ask the person to smile. Does one side of the face droop or is it numb?  Arms: Ask the person to raise both arms. Does one arm drift downward? Is there weakness or numbness of a leg? Speech: Ask the person to repeat a simple phrase. Does the speech sound slurred/strange? Is the person confused ? Time: If you observe any of these signs, call 911.        Thank you for coming to see [place] at [facility]. I hope we have been able to provide you high quality care today.  You may receive a patient satisfaction survey over the next few weeks. We would appreciate your feedback and comments so that we may continue to improve ourselves and the health of our patients.     Stroke Prevention Some medical conditions and lifestyle choices can  lead to a higher risk for a stroke. You can help to prevent a stroke by eating healthy foods and exercising. It also helps to not smoke and to manage any health problems you may have. How can this condition affect me? A stroke is an emergency. It should be treated right away. A stroke can lead to brain damage or threaten your life. There is a better chance of surviving and getting better after a stroke if you get medical help right away. What can increase my risk? The following medical conditions may increase your risk of a stroke: Diseases of the heart and blood vessels (cardiovascular disease). High blood pressure (hypertension). Diabetes. High cholesterol. Sickle cell disease. Problems with blood clotting. Being very overweight. Sleeping problems (obstructivesleep apnea). Other risk factors include: Being older than age 1. A history of blood clots, stroke, or mini-stroke (TIA). Race, ethnic background, or a family history of stroke. Smoking or using tobacco products. Taking birth control pills, especially if you smoke. Heavy alcohol and drug use. Not being active. What actions can I take to prevent this? Manage your health conditions High cholesterol. Eat a healthy diet. If this is not enough to manage your cholesterol, you may need to take medicines. Take medicines as told by your doctor. High blood pressure. Try to keep your blood pressure below 130/80. If your blood pressure cannot be managed through a healthy diet and regular exercise, you may need to take medicines. Take medicines as told by your doctor. Ask your  doctor if you should check your blood pressure at home. Have your blood pressure checked every year. Diabetes. Eat a healthy diet and get regular exercise. If your blood sugar (glucose) cannot be managed through diet and exercise, you may need to take medicines. Take medicines as told by your doctor. Talk to your doctor about getting checked for sleeping problems.  Signs of a problem can include: Snoring a lot. Feeling very tired. Make sure that you manage any other conditions you have. Nutrition  Follow instructions from your doctor about what to eat or drink. You may be told to: Eat and drink fewer calories each day. Limit how much salt (sodium) you use to 1,500 milligrams (mg) each day. Use only healthy fats for cooking, such as olive oil, canola oil, and sunflower oil. Eat healthy foods. To do this: Choose foods that are high in fiber. These include whole grains, and fresh fruits and vegetables. Eat at least 5 servings of fruits and vegetables a day. Try to fill one-half of your plate with fruits and vegetables at each meal. Choose low-fat (lean) proteins. These include low-fat cuts of meat, chicken without skin, fish, tofu, beans, and nuts. Eat low-fat dairy products. Avoid foods that: Are high in salt. Have saturated fat. Have trans fat. Have cholesterol. Are processed or pre-made. Count how many carbohydrates you eat and drink each day. Lifestyle If you drink alcohol: Limit how much you have to: 0-1 drink a day for women who are not pregnant. 0-2 drinks a day for men. Know how much alcohol is in your drink. In the [place] one drink equals one 12 oz bottle of beer ( ), one 5 oz glass of wine ( ), or one 1 oz glass of hard liquor (44mL). Do not smoke or use any products that have nicotine or tobacco. If you need help quitting, ask your doctor. Avoid secondhand smoke. Do not use drugs. Activity  Try to stay at a healthy weight. Get at least 30 minutes of exercise on most days, such as: Fast walking. Biking. Swimming. Medicines Take over-the-counter and prescription medicines only as told by your doctor. Avoid taking birth control pills. Talk to your doctor about the risks of taking birth control pills if: You are over 23 years old. You smoke. You get very bad headaches. You have had a blood clot. Where to find more  information American Stroke Association: www.strokeassociation.org Get help right away if: You or a loved one has any signs of a stroke. "BE FAST" is an easy way to remember the warning signs: B - Balance. Dizziness, sudden trouble walking, or loss of balance. E - Eyes. Trouble seeing or a change in how you see. F - Face. Sudden weakness or loss of feeling of the face. The face or eyelid may droop on one side. A - Arms. Weakness or loss of feeling in an arm. This happens all of a sudden and most often on one side of the body. S - Speech. Sudden trouble speaking, slurred speech, or trouble understanding what people say. T - Time. Time to call emergency services. Write down what time symptoms started. You or a loved one has other signs of a stroke, such as: A sudden, very bad headache with no known cause. Feeling like you may vomit (nausea). Vomiting. A seizure. These symptoms may be an emergency. Get help right away. Call your local emergency services (911 in the [place] Do not wait to see if the symptoms will go away. Do not drive yourself  to the hospital. Summary You can help to prevent a stroke by eating healthy, exercising, and not smoking. It also helps to manage any health problems you have. Do not smoke or use any products that contain nicotine or tobacco. Get help right away if you or a loved one has any signs of a stroke. This information is not intended to replace advice given to you by your health care provider. Make sure you discuss any questions you have with your health care provider. Document Revised: 04/18/2022 Document Reviewed: 04/18/2022 Elsevier Patient Education  2024 [employer].

## 2023-09-11 NOTE — Progress Notes (Signed)
 I agree with the above plan

## 2023-09-13 DIAGNOSIS — I679 Cerebrovascular disease, unspecified: Secondary | ICD-10-CM | POA: Diagnosis not present

## 2023-09-13 DIAGNOSIS — H2513 Age-related nuclear cataract, bilateral: Secondary | ICD-10-CM | POA: Diagnosis not present

## 2023-09-13 DIAGNOSIS — H53461 Homonymous bilateral field defects, right side: Secondary | ICD-10-CM | POA: Diagnosis not present

## 2023-11-14 DIAGNOSIS — H539 Unspecified visual disturbance: Secondary | ICD-10-CM | POA: Diagnosis not present

## 2023-11-14 DIAGNOSIS — R5383 Other fatigue: Secondary | ICD-10-CM | POA: Diagnosis not present

## 2023-11-14 DIAGNOSIS — E782 Mixed hyperlipidemia: Secondary | ICD-10-CM | POA: Diagnosis not present

## 2023-11-14 DIAGNOSIS — I4891 Unspecified atrial fibrillation: Secondary | ICD-10-CM | POA: Diagnosis not present

## 2023-11-14 DIAGNOSIS — Z8673 Personal history of transient ischemic attack (TIA), and cerebral infarction without residual deficits: Secondary | ICD-10-CM | POA: Diagnosis not present

## 2023-12-12 DIAGNOSIS — H53461 Homonymous bilateral field defects, right side: Secondary | ICD-10-CM | POA: Diagnosis not present

## 2023-12-12 DIAGNOSIS — I679 Cerebrovascular disease, unspecified: Secondary | ICD-10-CM | POA: Diagnosis not present

## 2023-12-12 DIAGNOSIS — H2513 Age-related nuclear cataract, bilateral: Secondary | ICD-10-CM | POA: Diagnosis not present
